# Patient Record
Sex: Female | Born: 1960
Health system: Southern US, Community
[De-identification: ages and names within clinical notes are randomized; demographics above are authoritative.]

## PROBLEM LIST (undated history)

## (undated) DIAGNOSIS — Z973 Presence of spectacles and contact lenses: Secondary | ICD-10-CM

## (undated) DIAGNOSIS — F419 Anxiety disorder, unspecified: Secondary | ICD-10-CM

## (undated) DIAGNOSIS — Z8719 Personal history of other diseases of the digestive system: Secondary | ICD-10-CM

## (undated) DIAGNOSIS — K219 Gastro-esophageal reflux disease without esophagitis: Secondary | ICD-10-CM

## (undated) DIAGNOSIS — N189 Chronic kidney disease, unspecified: Secondary | ICD-10-CM

## (undated) DIAGNOSIS — M1990 Unspecified osteoarthritis, unspecified site: Secondary | ICD-10-CM

## (undated) DIAGNOSIS — A6000 Herpesviral infection of urogenital system, unspecified: Secondary | ICD-10-CM

## (undated) DIAGNOSIS — Z01419 Encounter for gynecological examination (general) (routine) without abnormal findings: Secondary | ICD-10-CM

## (undated) DIAGNOSIS — F32A Depression, unspecified: Secondary | ICD-10-CM

## (undated) DIAGNOSIS — M12579 Traumatic arthropathy, unspecified ankle and foot: Secondary | ICD-10-CM

## (undated) DIAGNOSIS — M810 Age-related osteoporosis without current pathological fracture: Secondary | ICD-10-CM

## (undated) DIAGNOSIS — F329 Major depressive disorder, single episode, unspecified: Secondary | ICD-10-CM

## (undated) DIAGNOSIS — E669 Obesity, unspecified: Secondary | ICD-10-CM

## (undated) DIAGNOSIS — I10 Essential (primary) hypertension: Secondary | ICD-10-CM

## (undated) DIAGNOSIS — F17200 Nicotine dependence, unspecified, uncomplicated: Secondary | ICD-10-CM

## (undated) DIAGNOSIS — Z9289 Personal history of other medical treatment: Secondary | ICD-10-CM

## (undated) HISTORY — DX: Anxiety disorder, unspecified: F41.9

## (undated) HISTORY — DX: Encounter for gynecological examination (general) (routine) without abnormal findings: Z01.419

## (undated) HISTORY — PX: WISDOM TOOTH EXTRACTION: SHX21

## (undated) HISTORY — DX: Gilbert syndrome: E80.4

## (undated) HISTORY — DX: Nicotine dependence, unspecified, uncomplicated: F17.200

## (undated) HISTORY — DX: Herpesviral infection of urogenital system, unspecified: A60.00

## (undated) HISTORY — DX: Major depressive disorder, single episode, unspecified: F32.9

## (undated) HISTORY — DX: Essential (primary) hypertension: I10

## (undated) HISTORY — DX: Personal history of other medical treatment: Z92.89

## (undated) HISTORY — PX: CHALAZION EXCISION: SHX213

## (undated) HISTORY — DX: Unspecified osteoarthritis, unspecified site: M19.90

## (undated) HISTORY — DX: Personal history of other diseases of the digestive system: Z87.19

## (undated) HISTORY — DX: Presence of spectacles and contact lenses: Z97.3

## (undated) HISTORY — PX: HYSTEROSCOPY: SHX211

## (undated) HISTORY — DX: Gastro-esophageal reflux disease without esophagitis: K21.9

## (undated) HISTORY — DX: Depression, unspecified: F32.A

## (undated) HISTORY — DX: Chronic kidney disease, unspecified: N18.9

## (undated) HISTORY — DX: Age-related osteoporosis without current pathological fracture: M81.0

## (undated) HISTORY — DX: Traumatic arthropathy, unspecified ankle and foot: M12.579

## (undated) HISTORY — DX: Obesity, unspecified: E66.9

## (undated) HISTORY — PX: OTHER SURGICAL HISTORY: SHX169

---

## 1998-01-22 ENCOUNTER — Ambulatory Visit (HOSPITAL_COMMUNITY): Admission: RE | Admit: 1998-01-22 | Discharge: 1998-01-22 | Payer: Self-pay | Admitting: Family Medicine

## 1998-08-09 ENCOUNTER — Other Ambulatory Visit: Admission: RE | Admit: 1998-08-09 | Discharge: 1998-08-09 | Payer: Self-pay | Admitting: Obstetrics and Gynecology

## 1998-09-22 ENCOUNTER — Ambulatory Visit (HOSPITAL_COMMUNITY): Admission: RE | Admit: 1998-09-22 | Discharge: 1998-09-22 | Payer: Self-pay | Admitting: Obstetrics and Gynecology

## 1998-09-29 ENCOUNTER — Ambulatory Visit: Admission: RE | Admit: 1998-09-29 | Discharge: 1998-09-29 | Payer: Self-pay | Admitting: Family Medicine

## 1999-09-08 ENCOUNTER — Other Ambulatory Visit: Admission: RE | Admit: 1999-09-08 | Discharge: 1999-09-08 | Payer: Self-pay | Admitting: Obstetrics and Gynecology

## 2001-04-11 ENCOUNTER — Other Ambulatory Visit: Admission: RE | Admit: 2001-04-11 | Discharge: 2001-04-11 | Payer: Self-pay | Admitting: Obstetrics and Gynecology

## 2001-12-22 ENCOUNTER — Encounter: Payer: Self-pay | Admitting: Chiropractic Medicine

## 2001-12-22 ENCOUNTER — Ambulatory Visit (HOSPITAL_COMMUNITY): Admission: RE | Admit: 2001-12-22 | Discharge: 2001-12-22 | Payer: Self-pay | Admitting: Chiropractic Medicine

## 2002-05-22 ENCOUNTER — Other Ambulatory Visit: Admission: RE | Admit: 2002-05-22 | Discharge: 2002-05-22 | Payer: Self-pay | Admitting: Obstetrics and Gynecology

## 2003-06-22 ENCOUNTER — Ambulatory Visit (HOSPITAL_COMMUNITY): Admission: RE | Admit: 2003-06-22 | Discharge: 2003-06-22 | Payer: Self-pay | Admitting: Obstetrics and Gynecology

## 2003-10-23 ENCOUNTER — Other Ambulatory Visit: Admission: RE | Admit: 2003-10-23 | Discharge: 2003-10-23 | Payer: Self-pay | Admitting: Obstetrics and Gynecology

## 2004-06-23 ENCOUNTER — Ambulatory Visit (HOSPITAL_COMMUNITY): Admission: RE | Admit: 2004-06-23 | Discharge: 2004-06-23 | Payer: Self-pay | Admitting: Obstetrics and Gynecology

## 2005-01-26 ENCOUNTER — Other Ambulatory Visit: Admission: RE | Admit: 2005-01-26 | Discharge: 2005-01-26 | Payer: Self-pay | Admitting: Obstetrics and Gynecology

## 2005-07-03 ENCOUNTER — Ambulatory Visit (HOSPITAL_COMMUNITY): Admission: RE | Admit: 2005-07-03 | Discharge: 2005-07-03 | Payer: Self-pay | Admitting: Obstetrics and Gynecology

## 2006-06-01 ENCOUNTER — Ambulatory Visit: Payer: Self-pay | Admitting: Family Medicine

## 2006-06-12 ENCOUNTER — Ambulatory Visit: Payer: Self-pay | Admitting: Family Medicine

## 2006-06-14 ENCOUNTER — Encounter: Payer: Self-pay | Admitting: Cardiology

## 2006-06-14 ENCOUNTER — Ambulatory Visit: Payer: Self-pay

## 2006-06-14 DIAGNOSIS — Z9289 Personal history of other medical treatment: Secondary | ICD-10-CM

## 2006-06-14 HISTORY — DX: Personal history of other medical treatment: Z92.89

## 2006-07-12 ENCOUNTER — Ambulatory Visit (HOSPITAL_COMMUNITY): Admission: RE | Admit: 2006-07-12 | Discharge: 2006-07-12 | Payer: Self-pay | Admitting: Obstetrics and Gynecology

## 2007-07-15 ENCOUNTER — Ambulatory Visit (HOSPITAL_COMMUNITY): Admission: RE | Admit: 2007-07-15 | Discharge: 2007-07-15 | Payer: Self-pay | Admitting: Obstetrics and Gynecology

## 2007-07-24 ENCOUNTER — Ambulatory Visit: Payer: Self-pay | Admitting: Family Medicine

## 2008-07-17 ENCOUNTER — Ambulatory Visit (HOSPITAL_COMMUNITY): Admission: RE | Admit: 2008-07-17 | Discharge: 2008-07-17 | Payer: Self-pay | Admitting: Obstetrics and Gynecology

## 2008-07-29 ENCOUNTER — Inpatient Hospital Stay (HOSPITAL_COMMUNITY): Admission: EM | Admit: 2008-07-29 | Discharge: 2008-07-31 | Payer: Self-pay | Admitting: Emergency Medicine

## 2008-08-07 HISTORY — PX: ANKLE SURGERY: SHX546

## 2009-07-26 ENCOUNTER — Ambulatory Visit (HOSPITAL_COMMUNITY): Admission: RE | Admit: 2009-07-26 | Discharge: 2009-07-26 | Payer: Self-pay | Admitting: Obstetrics and Gynecology

## 2010-07-19 ENCOUNTER — Ambulatory Visit: Payer: Self-pay | Admitting: Family Medicine

## 2010-07-19 DIAGNOSIS — Z9289 Personal history of other medical treatment: Secondary | ICD-10-CM

## 2010-07-19 HISTORY — DX: Personal history of other medical treatment: Z92.89

## 2010-07-27 ENCOUNTER — Ambulatory Visit (HOSPITAL_COMMUNITY)
Admission: RE | Admit: 2010-07-27 | Discharge: 2010-07-27 | Payer: Self-pay | Source: Home / Self Care | Attending: Obstetrics and Gynecology | Admitting: Obstetrics and Gynecology

## 2010-08-16 ENCOUNTER — Ambulatory Visit
Admission: RE | Admit: 2010-08-16 | Discharge: 2010-08-16 | Payer: Self-pay | Source: Home / Self Care | Attending: Family Medicine | Admitting: Family Medicine

## 2010-10-14 ENCOUNTER — Ambulatory Visit (INDEPENDENT_AMBULATORY_CARE_PROVIDER_SITE_OTHER): Payer: 59 | Admitting: Family Medicine

## 2010-10-14 DIAGNOSIS — R079 Chest pain, unspecified: Secondary | ICD-10-CM

## 2010-10-14 DIAGNOSIS — I10 Essential (primary) hypertension: Secondary | ICD-10-CM

## 2010-10-14 DIAGNOSIS — Z7189 Other specified counseling: Secondary | ICD-10-CM

## 2010-12-20 NOTE — Op Note (Signed)
[name], [name] NO.:  [id number]   MEDICAL RECORD NO.:  [id number]          PATIENT TYPE:  INP   LOCATION:  1844                         FACILITY:  [facility]   PHYSICIAN:  Alvy Beal, MD    DATE OF BIRTH:  [birth year]   DATE OF PROCEDURE:  DATE OF DISCHARGE:                               OPERATIVE REPORT   PREOPERATIVE DIAGNOSIS:  Right trimalleolar ankle fracture subluxation.   POSTOPERATIVE DIAGNOSIS:  Right trimalleolar ankle fracture subluxation.   OPERATIVE PROCEDURE:  Open reduction internal fixation of the right  ankle.   FIRST ASSISTANT:  Crissie Reese, PA   INSTRUMENTATION USED:  DePuy small fragments with a six-hole locking  plate on the lateral side and 2 cannulated 4.5 screws on the medial  side.   COMPLICATIONS:  None.   CONDITION:  Stable.   TOURNIQUET TIME:  Approximately an hour and 40 minutes.   BLOOD LOSS:  No significant blood loss.   OPERATIVE HISTORY:  This is a very pleasant 50 year old woman who  presents after a fall on the ice today.  She is unable to ambulate and  was brought to the emergency room by her neighbor.  X-rays demonstrated  a trimalleolar ankle fracture.  After discussing risks, benefits, and  alternatives of surgery, the patient consented to an ORIF of the ankle.  All appropriate risks and benefits were discussed.   OPERATIVE NOTE:  The patient was brought to the operating room, placed  supine on the operating table.  After successful induction of general  anesthesia and endotracheal intubation, TED, SCDs, and Foley was  applied.  The tourniquet was placed on the right proximal thigh and the  right lower extremity was prepped and draped in standard fashion.  A  standard time out was done, confirmed, the right ankle was brought  inside.  The leg was exsanguinated and the tourniquet was inflated after  appropriate prepping and draping.   A lateral incision was made starting at the distal end of the  fibula and  proceeding approximately 10 cm.  Sharp dissection was carried out down  to the bone.  The bone was identified and the fracture site was  identified.  The enfolded periosteum and hematoma was removed and the  fracture was reduced and held in place with the clamp.  Fluoroscopy was  used to confirm that I had an anatomical correction.  I was also able to  directly visualize this and to also confirming anatomical reduction.  I  then contoured the plate and secured it to the fibula with 16-mm distal  unicortical cancellus screw followed by 14-mm unicortical cancellus  screw, both locking to the plate and then cortical 12, 14, and two 12s.  I was able to visualize the anterior surface of the fibula to confirm  that the screws were intraosseous and did not cut out.   At this point, x-rays demonstrated satisfactory reduction of the distal  fibula and the heart was in good position.   I then irrigated this wound copiously with normal saline, closed the  deep fascia with interrupted 2-0 Vicryl sutures and closed the  skin with  a running 2-0 PDS vertical mattress suture.  I then turned my attention  to the medial side.  A reverse hockey stick incision was made and I  dissected down to the medial malleolar fracture.  I identified the  fracture site, removed the hematoma and enfold the periosteum and  reduced it.  I was able to directly look at the axilla of the joint  space to confirm an anatomical reduction.  I then placed 2 guide pins  through the distal tip of the medial malleolus across the fracture site  and into the distal tibia.  This held the reduction in place.  I then  confirmed satisfactory position in the AP, lateral, and mortise views.  I confirmed that it was not intra-articular.  I then drilled the one  screw, placed a 44-mm screw with a washer on the most anterior and then  placed a 46-mm screw without a washer, the posterior one.  Reduction was  maintained both  radiographically and under direct visualization.  At  this point, I then irrigated the wound copiously with normal saline, and  then closed the deep fascia with 2-0 Vicryl sutures, and interrupted PDS  simple sutures for the skin.  A bulky dry dressing was applied as was  the posterior splint with side struts.  The patient was extubated and  transferred to PACU without incident.  At the end of the case, all  needle and sponge counts were correct.  The patient was hemodynamically  intact, moving all toes.      Alvy Beal, MD  Electronically Signed     DDB/MEDQ  D:  07/29/2008  T:  07/30/2008  Job:  [id number]

## 2010-12-20 NOTE — H&P (Signed)
NAME:  [name], STAPLES NO.:  [id number]   MEDICAL RECORD NO.:  [id number]          PATIENT TYPE:  EMS   LOCATION:  MAJO                         FACILITY:  [facility]   PHYSICIAN:  Alvy Beal, MD    DATE OF BIRTH:  February 21, 1961   DATE OF ADMISSION:  07/29/2008  DATE OF DISCHARGE:                              HISTORY & PHYSICAL   ADMITTING DIAGNOSIS:  Right ankle fracture.   HISTORY:  She is a very pleasant otherwise healthy 50 year old man who  was taking the garbage out earlier this morning and slipped and fell  while turning around on the ice.  She twisted the ankle and noted  immediate pain and inability to ambulate.  She was brought to the  emergency room by her neighbor and mother.  X-rays here in the emergency  room demonstrated a bimalleolar ankle fracture and so Orthopedic  consultation was requested.   PAST MEDICAL/SURGICAL/FAMILY/SOCIAL HISTORY:  She has had no significant  surgeries except for some foot surgeries by a podiatrist several years  ago.  She has mitral valve prolapse and no other significant medical  problems.   No known drug allergies.   She is just taking Cal-Citrate.  No other prescription medications.   On clinical exam, she is alert and oriented x3.  She was given some pain  medication, but she still coherent and can recall all of the events of  the injury.  She is afebrile.  Stable vital signs.  No shortness of  breath or chest pain.  The abdomen is soft and nontender.  She is moving  all her toes.  Capillary refill is less than 2 seconds.  The calf  compartments are soft and nontender.  She has discomfort and pain with  palpation of the ankle and obvious swelling.  No lacerations or  abrasions are noted.  No knee or hip pain on that extremity.   X-rays demonstrate a bimalleolar ankle fracture subluxation with distal  Weber B fibular fracture and an oblique medial malleolar fracture.   At this point in time, I have spoken with the  patient and her mother and  her husband by phone.  I have indicated that the treatment options  include cast immobilization and surgery.  I did think given the  bimalleolar fracture component, the best course of action is surgery.  We went over the risks, which include infection, bleeding, nerve damage,  death, stroke, paralysis, failure to heal, hardware failure, and need  for further surgery.  All of her questions were addressed and we will  plan on doing surgery later on this afternoon.  The patient last had  serial at 8:30 and so the safest time to do this will be at about 2:30.  We will admit her, keep her n.p.o., and plan on surgery this afternoon.      Alvy Beal, MD  Electronically Signed     DDB/MEDQ  D:  07/29/2008  T:  07/29/2008  Job:  [id number]

## 2011-01-18 ENCOUNTER — Telehealth: Payer: Self-pay | Admitting: *Deleted

## 2011-01-18 MED ORDER — LOSARTAN POTASSIUM-HCTZ 50-12.5 MG PO TABS: 1.0000 | ORAL_TABLET | Freq: Every day | ORAL | Status: DC

## 2011-01-18 NOTE — Telephone Encounter (Signed)
Patient switched to Hyzaar do to ACE cough.

## 2011-01-20 ENCOUNTER — Telehealth: Payer: Self-pay | Admitting: Family Medicine

## 2011-01-20 MED ORDER — AMLODIPINE BESYLATE 5 MG PO TABS: 5.0000 mg | ORAL_TABLET | Freq: Every day | ORAL | Status: DC

## 2011-01-20 NOTE — Telephone Encounter (Addendum)
Pt called stated new BP med not agreeing with her, light headed, dizzy, heart racing, hot flashes.  Doesn't feel good at all. Please call and advise her what to do? [facility]. Patient was called. I will switch her to Norvasc

## 2011-05-05 ENCOUNTER — Ambulatory Visit (INDEPENDENT_AMBULATORY_CARE_PROVIDER_SITE_OTHER): Payer: 59 | Admitting: Medical

## 2011-05-05 ENCOUNTER — Encounter: Payer: Self-pay | Admitting: Medical

## 2011-05-05 VITALS — BP 132/82 | HR 80 | Temp 98.1°F | Resp 18

## 2011-05-05 DIAGNOSIS — N390 Urinary tract infection, site not specified: Secondary | ICD-10-CM

## 2011-05-05 LAB — POCT URINALYSIS DIPSTICK
Bilirubin, UA: NEGATIVE
Glucose, UA: NEGATIVE
Ketones, UA: NEGATIVE
Leukocytes, UA: NEGATIVE
Nitrite, UA: POSITIVE
Protein, UA: NEGATIVE
Spec Grav, UA: 1.015
Urobilinogen, UA: NEGATIVE
pH, UA: 5

## 2011-05-05 MED ORDER — NITROFURANTOIN MONOHYD MACRO 100 MG PO CAPS: 100.0000 mg | ORAL_CAPSULE | Freq: Two times a day (BID) | ORAL | Status: AC

## 2011-05-05 MED ORDER — PHENAZOPYRIDINE HCL 100 MG PO TABS: 100.0000 mg | ORAL_TABLET | Freq: Three times a day (TID) | ORAL | Status: AC | PRN

## 2011-05-05 NOTE — Patient Instructions (Signed)
Urinary Tract Infection (UTI)   Infections of the urinary tract can start in several places. A bladder infection (cystitis), a kidney infection (pyelonephritis), and a prostate infection (prostatitis) are different types of urinary tract infections. They usually get better if treated with medicines (antibiotics) that kill germs. Take all the medicine until it is gone. You or your child may feel better in a few days, but TAKE ALL MEDICINE or the infection may not respond and may become more difficult to treat.   HOME CARE INSTRUCTIONS   Drink enough water and fluids to keep the urine clear or pale yellow. Cranberry juice is especially recommended, in addition to large amounts of water.   Avoid caffeine, tea, and carbonated beverages. They tend to irritate the bladder.   Alcohol may irritate the prostate.   Only take over-the-counter or prescription medicines for pain, discomfort, or fever as directed by your caregiver.   FINDING OUT THE RESULTS OF YOUR TEST   Not all test results are available during your visit. If your or your child's test results are not back during the visit, make an appointment with your caregiver to find out the results. Do not assume everything is normal if you have not heard from your caregiver or the medical facility. It is important for you to follow up on all test results.   TO PREVENT FURTHER INFECTIONS:   Empty the bladder often. Avoid holding urine for long periods of time.   After a bowel movement, women should cleanse from front to back. Use each tissue only once.   Empty the bladder before and after sexual intercourse.   SEEK MEDICAL CARE IF:   There is back pain.   You or your child has an oral temperature above 101.   Your baby is older than 3 months with a rectal temperature of 100.5º F (38.1° C) or higher for more than 1 day.   Your or your child's problems (symptoms) are no better in 3 days. Return sooner if you or your child is getting worse.   SEEK IMMEDIATE MEDICAL CARE IF:    There is severe back pain or lower abdominal pain.   You or your child develops chills.   You or your child has an oral temperature above 101, not controlled by medicine.   Your baby is older than 3 months with a rectal temperature of 102º F (38.9º C) or higher.   Your baby is 3 months old or younger with a rectal temperature of 100.4º F (38º C) or higher.   There is nausea or vomiting.   There is continued burning or discomfort with urination.   MAKE SURE YOU:   Understand these instructions.   Will watch this condition.   Will get help right away if you or your child is not doing well or gets worse.   Document Released: 05/03/2005 Document Re-Released: 10/18/2009   [employer]® Patient Information ©2011 [employer].

## 2011-05-05 NOTE — Progress Notes (Signed)
Subjective:    [name] is a 50 y.o. female who complains of burning with urination. She has had symptoms for 4 days. Patient also complains of urgency, frequency, odor, blood today visibly, lower abdominal pain, mild, and sympotms interfered with sleep last evening. Patient denies back pain, fever and vaginal discharge. Patient does not have a history of recurrent UTI. Patient does not have a history of pyelonephritis.  Last UTI years ago.  She does note last period in 09/2010.  She thinks she may be entering menopause.   The following portions of the patient's history were reviewed and updated as appropriate: allergies, current medications, past family history, past medical history, past social history, past surgical history and problem list.  Review of Systems  Constitutional: +chills; denies fever, sweats Cardiology: denies chest pain, palpitations Respiratory: denies cough, shortness of breath Gastroenterology: denies nausea, vomiting, diarrhea Urology: denies hematuria, incontinence    Objective:   Filed Vitals:   05/05/11 1131  BP: 132/82  Pulse: 80  Temp: 98.1 F (36.7 C)  Resp: 18    General appearance: alert, no distress, WD/WN,white female Oral cavity: MMM Heart: RRR, normal S1, S2, no murmurs Lungs: CTA bilaterally, no wheezes, rhonchi, or rales Abdomen: +bs, soft, mild lower abdominal tenderness, non distended, no masses, no hepatomegaly, no splenomegaly, no bruits Back: no CVA tenderness Pulses: 2+ symmetric   Assessment:   Encounter Diagnosis  Name Primary?  . UTI (urinary tract infection) Yes     Plan:    Medications: nitrofurantoin. Maintain adequate hydration. Follow up if symptoms not improving, and as needed.

## 2011-05-08 LAB — URINE CULTURE: Colony Count: >=100000

## 2011-05-12 LAB — CBC
HCT: 44.7 % (ref 36.0–46.0)
Hemoglobin: 15 g/dL (ref 12.0–15.0)
MCHC: 33.6 g/dL (ref 30.0–36.0)
MCV: 92.4 fL (ref 78.0–100.0)
Platelets: 233 10*3/uL (ref 150–400)
RBC: 4.84 MIL/uL (ref 3.87–5.11)
RDW: 12.6 % (ref 11.5–15.5)
WBC: 9.2 10*3/uL (ref 4.0–10.5)

## 2011-05-12 LAB — DIFFERENTIAL
Basophils Absolute: 0 10*3/uL (ref 0.0–0.1)
Basophils Relative: 0 % (ref 0–1)
Eosinophils Absolute: 0 10*3/uL (ref 0.0–0.7)
Eosinophils Relative: 0 % (ref 0–5)
Lymphocytes Relative: 11 % — ABNORMAL LOW (ref 12–46)
Lymphs Abs: 1 10*3/uL (ref 0.7–4.0)
Monocytes Absolute: 0.5 10*3/uL (ref 0.1–1.0)
Monocytes Relative: 5 % (ref 3–12)
Neutro Abs: 7.7 10*3/uL (ref 1.7–7.7)
Neutrophils Relative %: 84 % — ABNORMAL HIGH (ref 43–77)

## 2011-05-12 LAB — PROTIME-INR
INR: 1 (ref 0.00–1.49)
Prothrombin Time: 13.4 seconds (ref 11.6–15.2)

## 2011-05-12 LAB — POCT I-STAT, CHEM 8
BUN: 17 mg/dL (ref 6–23)
Calcium, Ion: 1.17 mmol/L (ref 1.12–1.32)
Chloride: 105 mEq/L (ref 96–112)
Creatinine, Ser: 0.8 mg/dL (ref 0.4–1.2)
Glucose, Bld: 159 mg/dL — ABNORMAL HIGH (ref 70–99)
HCT: 48 % — ABNORMAL HIGH (ref 36.0–46.0)
Hemoglobin: 16.3 g/dL — ABNORMAL HIGH (ref 12.0–15.0)
Potassium: 3.7 mEq/L (ref 3.5–5.1)
Sodium: 139 mEq/L (ref 135–145)
TCO2: 22 mmol/L (ref 0–100)

## 2011-05-12 LAB — APTT: aPTT: 22 seconds — ABNORMAL LOW (ref 24–37)

## 2011-05-29 ENCOUNTER — Encounter: Payer: Self-pay | Admitting: Family Medicine

## 2011-05-30 ENCOUNTER — Encounter: Payer: Self-pay | Admitting: Medical

## 2011-05-30 ENCOUNTER — Ambulatory Visit (INDEPENDENT_AMBULATORY_CARE_PROVIDER_SITE_OTHER): Payer: 59 | Admitting: Medical

## 2011-05-30 VITALS — BP 150/80 | HR 68 | Temp 97.7°F | Resp 16

## 2011-05-30 DIAGNOSIS — R300 Dysuria: Secondary | ICD-10-CM

## 2011-05-30 DIAGNOSIS — R319 Hematuria, unspecified: Secondary | ICD-10-CM

## 2011-05-30 DIAGNOSIS — I10 Essential (primary) hypertension: Secondary | ICD-10-CM | POA: Insufficient documentation

## 2011-05-30 LAB — POCT URINALYSIS DIPSTICK
Bilirubin, UA: NEGATIVE
Blood, UA: NEGATIVE
Glucose, UA: NEGATIVE
Ketones, UA: NEGATIVE
Leukocytes, UA: POSITIVE
Nitrite, UA: NEGATIVE
Protein, UA: NEGATIVE
Spec Grav, UA: 1.01
Urobilinogen, UA: NEGATIVE
pH, UA: 8

## 2011-05-30 MED ORDER — CIPROFLOXACIN HCL 500 MG PO TABS: 500.0000 mg | ORAL_TABLET | Freq: Two times a day (BID) | ORAL | Status: AC

## 2011-05-30 MED ORDER — AMLODIPINE BESYLATE 10 MG PO TABS: 10.0000 mg | ORAL_TABLET | Freq: Every day | ORAL | Status: DC

## 2011-05-30 NOTE — Progress Notes (Signed)
Subjective:   HPI  [name] is a 50 y.o. female who presents for 2 c/o.  She was seen by me about a month ago for UTI.  This cleared with antibiotic, but she thinks she has another UTI now.  She notes 2 days of mild urinary discomfort, urgency, but no blood, back or belly pain, no fever or chills.   LMP 2/12, no vaginal symptoms.    She is on blood pressure medication, but both here and other places such as the drug store, her BP has been elevated.  Thinks her BP medication isn't strong enough.  She says she can tell when her pressure is up based on how her body feels.   She is compliant with Amlodipine 5mg  daily.  She is exercising daily for at least 1 hour on the treadmill.  Uses no salt diet.  No other c/o.  The following portions of the patient's history were reviewed and updated as appropriate: allergies, current medications, past family history, past medical history, past social history, past surgical history and problem list.  Past Medical History  Diagnosis Date  . GERD (gastroesophageal reflux disease)   . Anxiety     GAD  . Hypertension   . MVP (mitral valve prolapse)     mild  . Migraine   . Endometriosis     Dr. Huntley Dec  . Traumatic arthritis of ankle     right  . Gilbert disease   . Obesity   . MVP (mitral valve prolapse)     MILD   History   Social History  . Marital Status: Married    Spouse Name: N/A    Number of Children: N/A  . Years of Education: N/A   Occupational History  . Not on file.   Social History Main Topics  . Smoking status: Former Smoker    Quit date: 05/04/2004  . Smokeless tobacco: Never Used  . Alcohol Use: 0.5 oz/week    1 drink(s) per week  . Drug Use: No  . Sexually Active: Not on file     exercising regularly   Other Topics Concern  . Not on file   Social History Narrative  . No narrative on file    Review of Systems Constitutional: -fever, -chills, +sweats, -unexpected -weight change,-fatigue ENT: -runny nose,  -ear pain, -sore throat Cardiology:  -chest pain, -palpitations, -edema Respiratory: -cough, -shortness of breath, -wheezing Gastroenterology: -abdominal pain, -nausea, -vomiting, -diarrhea, -constipation Hematology: -bleeding or bruising problems Musculoskeletal: -arthralgias, -myalgias, -joint swelling, -back pain Ophthalmology: -vision changes Urology: -dysuria, +difficulty urinating, +hematuria, -urinary frequency, -urgency Neurology: -headache, -weakness, -tingling, -numbness   Objective:   Physical Exam  Filed Vitals:   05/30/11 0839  BP: 150/80  Pulse: 68  Temp: 97.7 F (36.5 C)  Resp: 16    General appearance: alert, no distress, WD/WN, white female HEENT: normocephalic, sclerae anicteric, PERRLA, EOMi Neck: supple, no lymphadenopathy, no thyromegaly, no masses, no bruits Heart: RRR, normal S1, S2, no murmurs Lungs: CTA bilaterally, no wheezes, rhonchi, or rales Abdomen: +bs, soft, mild lower abdominal tenderness, non distended, no masses, no hepatomegaly, no splenomegaly Back: non tender Extremities: no edema, no cyanosis, no clubbing Pulses: 2+ symmetric, upper and lower extremities, normal cap refill Neurological: alert, oriented x 3, CN2-12 intact   Assessment and Plan :     Encounter Diagnoses  Name Primary?  . Essential hypertension, benign Yes  . Urine blood   . Dysuria     HTN - increase Amlodipine to 10mg   daily.  Recheck 62mo nurse visit BP check.  Return in January for annual physical/labs.  Dysuria - reviewed urine culture result from a month ago, +ecoli, sensitive to Macrobid which she used recently and sensitive to Cipro.  Will send script for Cipro.  Hydrate well, call/return if not improving.   Follow-up 62mo.

## 2011-06-23 ENCOUNTER — Other Ambulatory Visit (HOSPITAL_COMMUNITY): Payer: Self-pay | Admitting: Obstetrics and Gynecology

## 2011-06-23 DIAGNOSIS — Z1231 Encounter for screening mammogram for malignant neoplasm of breast: Secondary | ICD-10-CM

## 2011-06-26 ENCOUNTER — Other Ambulatory Visit: Payer: Self-pay | Admitting: Medical

## 2011-06-26 ENCOUNTER — Telehealth: Payer: Self-pay | Admitting: Medical

## 2011-06-26 MED ORDER — CIPROFLOXACIN HCL 500 MG PO TABS: 500.0000 mg | ORAL_TABLET | Freq: Two times a day (BID) | ORAL | Status: AC

## 2011-06-26 NOTE — Telephone Encounter (Signed)
rx sent for Cipro.  Lets have her recheck in 2wk OV to recheck and repeat urine and culture.

## 2011-07-14 ENCOUNTER — Ambulatory Visit (INDEPENDENT_AMBULATORY_CARE_PROVIDER_SITE_OTHER): Payer: 59 | Admitting: Medical

## 2011-07-14 ENCOUNTER — Encounter: Payer: Self-pay | Admitting: Medical

## 2011-07-14 DIAGNOSIS — R8290 Unspecified abnormal findings in urine: Secondary | ICD-10-CM

## 2011-07-14 DIAGNOSIS — R82998 Other abnormal findings in urine: Secondary | ICD-10-CM

## 2011-07-14 DIAGNOSIS — K5900 Constipation, unspecified: Secondary | ICD-10-CM | POA: Insufficient documentation

## 2011-07-14 DIAGNOSIS — N926 Irregular menstruation, unspecified: Secondary | ICD-10-CM

## 2011-07-14 DIAGNOSIS — I10 Essential (primary) hypertension: Secondary | ICD-10-CM

## 2011-07-14 LAB — POCT URINALYSIS DIPSTICK
Bilirubin, UA: NEGATIVE
Leukocytes, UA: NEGATIVE
Nitrite, UA: NEGATIVE
pH, UA: 7

## 2011-07-14 LAB — POCT URINE PREGNANCY: Preg Test, Ur: NEGATIVE

## 2011-07-14 MED ORDER — POLYETHYLENE GLYCOL 3350 17 G PO PACK: 17.0000 g | PACK | Freq: Every day | ORAL | Status: DC

## 2011-07-14 MED ORDER — POLYETHYLENE GLYCOL 3350 17 G PO PACK: 17.0000 g | PACK | Freq: Every day | ORAL | Status: AC

## 2011-07-14 NOTE — Progress Notes (Signed)
Subjective:   HPI  [name] is a 50 y.o. female who presents with recheck on urine.  I saw her about a month ago when she had 2 back to back UTIs.  This was a followup.  She has no current UTI symptoms.  She took Cipro last time and symptoms resolved.  She also wants a urine pregnancy test.  She recently divorced, has a new partner, is using condoms, but wants to make sure she isn't pregnant.  LMP around Halloween, but she is in pre menopause, last year only had 3 periods and they are slowing down.   She is also using pullout method and new partner has vasectomy planned in 2 weeks.  She has a gynecologist and sees them for pap/screening.  She also reports longstanding constipation since age 52yo.  Only has BMs about once weekly, gets a lot of gas.  No blood in stool.  No family hx/o colon cancer.  She is using laxatives occasionally.  She is also trying to change her diet to include more water, fiber, and fruits and potassium rich foods.  No other aggravating or relieving factors.    No other c/o.  The following portions of the patient's history were reviewed and updated as appropriate: allergies, current medications, past family history, past medical history, past social history, past surgical history and problem list.  Past Medical History  Diagnosis Date  . GERD (gastroesophageal reflux disease)   . Anxiety     GAD  . Hypertension   . MVP (mitral valve prolapse)     mild  . Migraine   . Endometriosis     Dr. Huntley Dec  . Traumatic arthritis of ankle     right  . Gilbert disease   . Obesity   . MVP (mitral valve prolapse)     MILD   Review of Systems Constitutional: -fever, -chills, -sweats, -unexpected -weight change,-fatigue ENT: -runny nose, -ear pain, -sore throat Cardiology:  -chest pain, -palpitations, -edema Respiratory: -cough, -shortness of breath, -wheezing Gastroenterology: -abdominal pain, -nausea, -vomiting, -diarrhea, -constipation Hematology: -bleeding or  bruising problems Musculoskeletal: -arthralgias, -myalgias, -joint swelling, -back pain Ophthalmology: -vision changes Urology: -dysuria, -difficulty urinating, -hematuria, -urinary frequency, -urgency Neurology: -headache, -weakness, -tingling, -numbness      Objective:   Physical Exam  Filed Vitals:   07/14/11 0811  BP: 150/80  Pulse: 68  Temp: 97.9 F (36.6 C)  Resp: 16    General appearance: alert, no distress, WD/WN, white female Oral cavity: MMM, no lesions Neck: supple, no lymphadenopathy, no thyromegaly, no masses Heart: RRR, normal S1, S2, no murmurs Lungs: CTA bilaterally, no wheezes, rhonchi, or rales Abdomen: +bs, soft, non tender, non distended, no masses, no hepatomegaly, no splenomegaly Pulses: 2+ symmetric, upper and lower extremities, normal cap refill   Assessment and Plan :    Encounter Diagnoses  Name Primary?  . Constipation Yes  . Essential hypertension, benign   . Abnormal urinalysis   . Missed period    Constipation - begin Miralax, discussed dietary changes, and call in 3-4 wk to make sure this is working  HTN - c/t same medication, check BP at home, and recheck 62mo.  Urinalysis today and urine pregnancy negative.    Follow-up with call in 62mo.

## 2011-08-03 ENCOUNTER — Ambulatory Visit (HOSPITAL_COMMUNITY): Payer: 59

## 2011-08-18 ENCOUNTER — Ambulatory Visit (HOSPITAL_COMMUNITY)
Admission: RE | Admit: 2011-08-18 | Discharge: 2011-08-18 | Disposition: A | Discharge status: Home / Self Care | Payer: 59 | Source: Ambulatory Visit | Attending: Obstetrics and Gynecology | Admitting: Obstetrics and Gynecology

## 2011-08-18 DIAGNOSIS — Z1231 Encounter for screening mammogram for malignant neoplasm of breast: Secondary | ICD-10-CM | POA: Insufficient documentation

## 2011-12-22 ENCOUNTER — Encounter: Payer: Self-pay | Admitting: Medical

## 2011-12-22 ENCOUNTER — Ambulatory Visit (INDEPENDENT_AMBULATORY_CARE_PROVIDER_SITE_OTHER): Payer: 59 | Admitting: Medical

## 2011-12-22 VITALS — BP 138/80 | HR 88 | Temp 98.3°F | Resp 16 | Wt 208.0 lb

## 2011-12-22 DIAGNOSIS — I10 Essential (primary) hypertension: Secondary | ICD-10-CM

## 2011-12-22 DIAGNOSIS — E663 Overweight: Secondary | ICD-10-CM

## 2011-12-22 LAB — CBC
Platelets: 273 10*3/uL (ref 150–400)
RBC: 5.02 MIL/uL (ref 3.87–5.11)
WBC: 6 10*3/uL (ref 4.0–10.5)

## 2011-12-22 LAB — LIPID PANEL
Total CHOL/HDL Ratio: 1.7 Ratio
VLDL: 8 mg/dL (ref 0–40)

## 2011-12-22 LAB — COMPREHENSIVE METABOLIC PANEL
ALT: 14 U/L (ref 0–35)
AST: 18 U/L (ref 0–37)
Chloride: 103 mEq/L (ref 96–112)
Creat: 0.8 mg/dL (ref 0.50–1.10)
Total Bilirubin: 1.1 mg/dL (ref 0.3–1.2)

## 2011-12-22 LAB — TSH: TSH: 1.5 u[IU]/mL (ref 0.350–4.500)

## 2011-12-22 MED ORDER — AMLODIPINE BESYLATE 10 MG PO TABS: 10.0000 mg | ORAL_TABLET | Freq: Every day | ORAL | Status: DC

## 2011-12-22 NOTE — Progress Notes (Signed)
  Subjective:   HPI  [name] is a 51 y.o. female who presents for routine check on HTN.  Compliant with medication, exercising 3 days per week with jogging, trying to eat healthy.  Not having any more constipation issues after going gluten free, and using Miralax some.  She learned that her mother gluten sensitivity.  She wants to begin prenatal vitamins OTC for hair health.   Last saw gynecology few months ago, sees Dr. Huntley Dec.  Up to date on mammogram and pap smear.   Never had colonoscopy.    No other c/o.  The following portions of the patient's history were reviewed and updated as appropriate: allergies, current medications, past family history, past medical history, past social history, past surgical history and problem list.  Past Medical History  Diagnosis Date  . GERD (gastroesophageal reflux disease)   . Anxiety     GAD  . Hypertension   . Migraine   . Endometriosis     Dr. Huntley Dec  . Traumatic arthritis of ankle     right  . Gilbert disease   . Obesity   . MVP (mitral valve prolapse)     MILD    Allergies  Allergen Reactions  . Ace Inhibitors Cough    Review of Systems ROS reviewed and was negative other than noted in HPI or above.    Objective:   Physical Exam  General appearance: alert, no distress, WD/WN Neck: supple, no lymphadenopathy, no thyromegaly, no masses Heart: RRR, normal S1, S2, no murmurs Lungs: CTA bilaterally, no wheezes, rhonchi, or rales Abdomen: +bs, soft, non tender, non distended, no masses, no hepatomegaly, no splenomegaly Pulses: 2+ symmetric Ext: no edema  Assessment and Plan :     Encounter Diagnoses  Name Primary?  . Essential hypertension, benign Yes  . Overweight    HTN - controlled on current medication, labs today  Overweight - discussed diet, exercise  Advised that she can use Prenatal vitamins OTC.    Of note, reviewed prior echocardiogram and based on heart exam and echo, I advised that she does not have  mitral valve prolapse.  Apparently somewhere along the way she was advised of this diagnosis, but there is not evidence of this currently.

## 2011-12-25 ENCOUNTER — Encounter: Payer: Self-pay | Admitting: Medical

## 2012-03-28 ENCOUNTER — Other Ambulatory Visit: Payer: Self-pay

## 2012-03-28 ENCOUNTER — Telehealth: Payer: Self-pay | Admitting: Family Medicine

## 2012-03-28 NOTE — Telephone Encounter (Signed)
Have her come by and drop off a urine specimen 

## 2012-03-28 NOTE — Telephone Encounter (Signed)
PT INFORMED TO DROP OF URINE SAMPLE

## 2012-03-29 ENCOUNTER — Other Ambulatory Visit (INDEPENDENT_AMBULATORY_CARE_PROVIDER_SITE_OTHER): Payer: 59

## 2012-03-29 ENCOUNTER — Other Ambulatory Visit: Payer: Self-pay | Admitting: Medical

## 2012-03-29 DIAGNOSIS — R300 Dysuria: Secondary | ICD-10-CM

## 2012-03-29 LAB — POCT URINALYSIS DIPSTICK
Bilirubin, UA: NEGATIVE
Ketones, UA: NEGATIVE
Leukocytes, UA: NEGATIVE
Nitrite, UA: NEGATIVE
Protein, UA: NEGATIVE
pH, UA: 8

## 2012-03-29 MED ORDER — NITROFURANTOIN MONOHYD MACRO 100 MG PO CAPS: 100.0000 mg | ORAL_CAPSULE | Freq: Two times a day (BID) | ORAL | Status: AC

## 2012-06-18 ENCOUNTER — Ambulatory Visit (INDEPENDENT_AMBULATORY_CARE_PROVIDER_SITE_OTHER): Payer: 59 | Admitting: Medical

## 2012-06-18 ENCOUNTER — Encounter: Payer: Self-pay | Admitting: Medical

## 2012-06-18 VITALS — BP 130/88 | HR 84 | Temp 98.1°F | Resp 16

## 2012-06-18 DIAGNOSIS — M76899 Other specified enthesopathies of unspecified lower limb, excluding foot: Secondary | ICD-10-CM

## 2012-06-18 DIAGNOSIS — M549 Dorsalgia, unspecified: Secondary | ICD-10-CM

## 2012-06-18 DIAGNOSIS — E663 Overweight: Secondary | ICD-10-CM

## 2012-06-18 DIAGNOSIS — M7062 Trochanteric bursitis, left hip: Secondary | ICD-10-CM

## 2012-06-18 DIAGNOSIS — I10 Essential (primary) hypertension: Secondary | ICD-10-CM

## 2012-06-18 MED ORDER — AMLODIPINE BESYLATE 10 MG PO TABS: 10.0000 mg | ORAL_TABLET | Freq: Every day | ORAL | Status: DC

## 2012-06-18 MED ORDER — IBUPROFEN 800 MG PO TABS: 800.0000 mg | ORAL_TABLET | Freq: Three times a day (TID) | ORAL | Status: DC | PRN

## 2012-06-18 NOTE — Progress Notes (Signed)
Subjective: Here for recheck on hypertension, med refill.  Doing well without c/o on medication Amlodipine.  Exercising some but not as much as prior. Eats healthy, does sometimes added salt.  Overall no regular/frequent headaches.  She does note some hot flashes.   LMP 03/2012, and periods are still mostly regular.  Her gynecologist told her she was perimenopausal.   She is going through a divorce, but is engaged to her current boyfriend.    She reports some left hip pain lately.  Flares up from time to time.  No injury or trauma.  She did lift something heavy few weeks ago and hurt her back.  Would like refill on Ibuprofen.   She has used this in the past for back pain.   Past Medical History  Diagnosis Date  . GERD (gastroesophageal reflux disease)   . Anxiety     GAD  . Hypertension   . Migraine   . Endometriosis     Dr. Huntley Dec  . Traumatic arthritis of ankle     right  . Gilbert disease   . Obesity    ROS as in HPI    Objective:   Physical Exam  Filed Vitals:   06/18/12 1421  BP: 130/88  Pulse: 84  Temp: 98.1 F (36.7 C)  Resp: 16    General appearance: alert, no distress, WD/WN Neck: supple, no lymphadenopathy, no thyromegaly, no masses Heart: RRR, normal S1, S2, no murmurs Lungs: CTA bilaterally, no wheezes, rhonchi, or rales Abdomen: +bs, soft, non tender, non distended, no masses, no hepatomegaly, no splenomegaly MSK: left leg tender over trochanteric bursa, otherwise hips and legs nontender, normal ROM, no swelling or deformity Back: mild lumbar midline tenderness, no pain with ROM, ROM relatively full Neuro: normal heel and toe walk, -SLR, normal UE and LE DTRs.  Pulses: 2+ symmetric, upper and lower extremities, normal cap refill   Assessment and Plan :    Encounter Diagnoses  Name Primary?  . Essential hypertension, benign Yes  . Overweight   . Trochanteric bursitis of left hip   . Back pain    HTN - controlled on current medication.  C/t Amlodipine  10mg  daily.  Exercise more, eat healthy low fat diet, no added salt.    Overweight - work on improving exercise,try and lose some weight.  Trochanteric bursitis of left hip.  Script for ibuprofen prn.  discussed diagnosis, treatment, prevention.  Back pain - ibuprofen prn but not daily.  Work on regular stretching, exercise.  Call/return if not improving or worse.   Follow-up 28mo.

## 2012-06-20 ENCOUNTER — Other Ambulatory Visit (INDEPENDENT_AMBULATORY_CARE_PROVIDER_SITE_OTHER): Payer: 59

## 2012-06-20 DIAGNOSIS — Z23 Encounter for immunization: Secondary | ICD-10-CM

## 2012-06-21 ENCOUNTER — Other Ambulatory Visit: Payer: 59

## 2012-09-21 ENCOUNTER — Other Ambulatory Visit: Payer: Self-pay

## 2012-10-14 ENCOUNTER — Other Ambulatory Visit (HOSPITAL_COMMUNITY): Payer: Self-pay | Admitting: Obstetrics and Gynecology

## 2012-10-14 DIAGNOSIS — Z1231 Encounter for screening mammogram for malignant neoplasm of breast: Secondary | ICD-10-CM

## 2012-10-25 ENCOUNTER — Ambulatory Visit (HOSPITAL_COMMUNITY)
Admission: RE | Admit: 2012-10-25 | Discharge: 2012-10-25 | Disposition: A | Discharge status: Home / Self Care | Payer: 59 | Source: Ambulatory Visit | Attending: Obstetrics and Gynecology | Admitting: Obstetrics and Gynecology

## 2012-10-25 DIAGNOSIS — Z1231 Encounter for screening mammogram for malignant neoplasm of breast: Secondary | ICD-10-CM

## 2012-12-11 ENCOUNTER — Ambulatory Visit (INDEPENDENT_AMBULATORY_CARE_PROVIDER_SITE_OTHER): Payer: 59 | Admitting: Medical

## 2012-12-11 ENCOUNTER — Encounter: Payer: Self-pay | Admitting: Medical

## 2012-12-11 VITALS — BP 132/80 | HR 88 | Temp 98.1°F | Resp 16 | Wt 217.0 lb

## 2012-12-11 DIAGNOSIS — M76899 Other specified enthesopathies of unspecified lower limb, excluding foot: Secondary | ICD-10-CM

## 2012-12-11 DIAGNOSIS — M549 Dorsalgia, unspecified: Secondary | ICD-10-CM

## 2012-12-11 DIAGNOSIS — M7062 Trochanteric bursitis, left hip: Secondary | ICD-10-CM

## 2012-12-11 DIAGNOSIS — M533 Sacrococcygeal disorders, not elsewhere classified: Secondary | ICD-10-CM

## 2012-12-11 DIAGNOSIS — I10 Essential (primary) hypertension: Secondary | ICD-10-CM

## 2012-12-11 DIAGNOSIS — E669 Obesity, unspecified: Secondary | ICD-10-CM

## 2012-12-11 MED ORDER — IBUPROFEN 800 MG PO TABS: 800.0000 mg | ORAL_TABLET | Freq: Three times a day (TID) | ORAL | Status: DC | PRN

## 2012-12-11 MED ORDER — AMLODIPINE BESYLATE 10 MG PO TABS: 10.0000 mg | ORAL_TABLET | Freq: Every day | ORAL | Status: DC

## 2012-12-11 NOTE — Progress Notes (Signed)
Subjective:  [name] is a 52 y.o. female who presents for interim f/u.  Needs refills on BP medication.  Not exercising as much as she should.   She notes diet is good though.  Sits at desk all day, has 1 hour commute each way daily.  Tries to get up somewhat often and walk at work.  Trying to exercise more in general.  She does get swelling with amlodipine but this is tolerable.   Takes Ibuprofen for low back pain and left hip prn, not very often.  Also been using Ibuprofen for teeth pain.  Having root canal next week.  Lying on back a certain way irritates the back.  Lifting things hurts.  If bending or squatting for long periods, this can aggravate.  Motion doesn't necessarily aggravate the back.   Using the stairs a lot aggravates the hip.  No other aggravating or relieving factors.    No other c/o.  The following portions of the patient's history were reviewed and updated as appropriate: allergies, current medications, past family history, past medical history, past social history, past surgical history and problem list.  ROS Otherwise as in subjective above  Past Medical History  Diagnosis Date  . GERD (gastroesophageal reflux disease)   . Anxiety     GAD  . Hypertension   . Migraine   . Endometriosis     Dr. Huntley Dec  . Traumatic arthritis of ankle     right  . Gilbert disease   . Obesity   . H/O echocardiogram 06/14/06    LV EF 55-60%, left atrium midly dilated, othewrise normal  . History of EKG 07/19/10    normal EKG  . Routine gynecological examination     sees gynecology yearly    Objective: Physical Exam  Vital signs reviewed  General appearance: alert, no distress, WD/WN  Neck: supple, no lymphadenopathy, no thyromegaly, no masses  Heart: RRR, normal S1, S2, no murmurs  Lungs: CTA bilaterally, no wheezes, rhonchi, or rales  Abdomen: +bs, soft, non tender, non distended, no masses, no hepatomegaly, no splenomegaly  MSK: left leg tender over trochanteric  bursa, otherwise hips and legs nontender, normal ROM, no swelling or deformity Back: mild lumbar midline tenderness, L>R, tender SI joints bilat, no pain with ROM, ROM relatively full  Neuro: normal heel and toe walk, -SLR, normal UE and LE DTRs.  Pulses: 2+ symmetric, upper and lower extremities, normal cap refill   Assessment: Encounter Diagnoses  Name Primary?  . Essential hypertension, benign Yes  . Obesity, unspecified   . Back pain   . Sacroiliac joint pain   . Trochanteric bursitis of left hip     Plan: HTN - controlled, c/t Amlodipine.  She doesn't want to switch due to edema since this has otherwise worked for her.   Reviewed last labs.  Obesity - advised weight loss, increased exercise, healthy diet.  Back pain, SI pain - hx/o disc herniation 2003 MRI, weight is a factor, large breasts could be a factor.  Advised increased exercise, but consider swimming, Yoga as well.   Ibuprofen prn, not daily.  If pain worsens, consider other therapies  Bursitis - declines steroidal injection.   Avoid activity that aggravates the pain.  Ibuprofen prn.   Follow up: 3mo on back, bursitis  Advised she f/u with gyn for pap soon, mammogram normal 10/2012, colonoscopy due age 9yo.  Congratulated her on her recent marriage.

## 2013-06-12 ENCOUNTER — Other Ambulatory Visit: Payer: Self-pay

## 2013-10-24 ENCOUNTER — Other Ambulatory Visit (HOSPITAL_COMMUNITY): Payer: Self-pay | Admitting: Obstetrics and Gynecology

## 2013-10-24 DIAGNOSIS — Z1231 Encounter for screening mammogram for malignant neoplasm of breast: Secondary | ICD-10-CM

## 2013-11-06 ENCOUNTER — Ambulatory Visit (HOSPITAL_COMMUNITY): Payer: 59

## 2013-11-18 ENCOUNTER — Ambulatory Visit (HOSPITAL_COMMUNITY)
Admission: RE | Admit: 2013-11-18 | Discharge: 2013-11-18 | Disposition: A | Discharge status: Home / Self Care | Payer: 59 | Source: Ambulatory Visit | Attending: Obstetrics and Gynecology | Admitting: Obstetrics and Gynecology

## 2013-11-18 DIAGNOSIS — Z1231 Encounter for screening mammogram for malignant neoplasm of breast: Secondary | ICD-10-CM | POA: Insufficient documentation

## 2013-12-09 ENCOUNTER — Other Ambulatory Visit: Payer: Self-pay | Admitting: Family Medicine

## 2013-12-09 ENCOUNTER — Encounter: Payer: Self-pay | Admitting: Medical

## 2013-12-09 ENCOUNTER — Ambulatory Visit (INDEPENDENT_AMBULATORY_CARE_PROVIDER_SITE_OTHER): Payer: 59 | Admitting: Medical

## 2013-12-09 VITALS — BP 120/80 | HR 82 | Temp 98.3°F | Resp 14 | Ht 67.25 in | Wt 222.0 lb

## 2013-12-09 DIAGNOSIS — E669 Obesity, unspecified: Secondary | ICD-10-CM

## 2013-12-09 DIAGNOSIS — I10 Essential (primary) hypertension: Secondary | ICD-10-CM

## 2013-12-09 DIAGNOSIS — N951 Menopausal and female climacteric states: Secondary | ICD-10-CM

## 2013-12-09 LAB — BASIC METABOLIC PANEL
BUN: 15 mg/dL (ref 6–23)
CO2: 26 mEq/L (ref 19–32)
CREATININE: 0.93 mg/dL (ref 0.50–1.10)
Calcium: 9.3 mg/dL (ref 8.4–10.5)
Chloride: 102 mEq/L (ref 96–112)
Glucose, Bld: 92 mg/dL (ref 70–99)
POTASSIUM: 3.9 meq/L (ref 3.5–5.3)
Sodium: 137 mEq/L (ref 135–145)

## 2013-12-09 MED ORDER — AMLODIPINE BESYLATE 10 MG PO TABS: 10.0000 mg | ORAL_TABLET | Freq: Every day | ORAL | Status: DC

## 2013-12-09 NOTE — Patient Instructions (Signed)
Thank you for giving me the opportunity to serve you today.   Your diagnosis today includes: Encounter Diagnoses  Name Primary?  . Essential hypertension, benign Yes  . Obesity, unspecified   . Menopausal state      Specific recommendations today include: Check insurance coverage for the following weight loss medications:  Qsymia  Phentermine  Belviq  Contrave  Consider OTC Estroven for menopausal symptoms  Consider Benadryl at bedtime for sleep  Consider Effexor prescription as alternate for menopausal symptoms.   Menopause Menopause is the normal time of life when menstrual periods stop completely. Menopause is complete when you have missed 12 consecutive menstrual periods. It usually occurs between the ages of 11 years and 91 years. Very rarely does a woman develop menopause before the age of 39 years. At menopause, your ovaries stop producing the female hormones estrogen and progesterone. This can cause undesirable symptoms and also affect your health. Sometimes the symptoms may occur 4 5 years before the menopause begins. There is no relationship between menopause and:  Oral contraceptives.  Number of children you had.  Race.  The age your menstrual periods started (menarche). Heavy smokers and very thin women may develop menopause earlier in life. CAUSES  The ovaries stop producing the female hormones estrogen and progesterone.  Other causes include:  Surgery to remove both ovaries.  The ovaries stop functioning for no known reason.  Tumors of the pituitary gland in the brain.  Medical disease that affects the ovaries and hormone production.  Radiation treatment to the abdomen or pelvis.  Chemotherapy that affects the ovaries. SYMPTOMS   Hot flashes.  Night sweats.  Decrease in sex drive.  Vaginal dryness and thinning of the vagina causing painful intercourse.  Dryness of the skin and developing  wrinkles.  Headaches.  Tiredness.  Irritability.  Memory problems.  Weight gain.  Bladder infections.  Hair growth of the face and chest.  Infertility. More serious symptoms include:  Loss of bone (osteoporosis) causing breaks (fractures).  Depression.  Hardening and narrowing of the arteries (atherosclerosis) causing heart attacks and strokes. DIAGNOSIS   When the menstrual periods have stopped for 12 straight months.  Physical exam.  Hormone studies of the blood. TREATMENT  There are many treatment choices and nearly as many questions about them. The decisions to treat or not to treat menopausal changes is an individual choice made with your health care provider. Your health care provider can discuss the treatments with you. Together, you can decide which treatment will work best for you. Your treatment choices may include:   Hormone therapy (estrogen and progesterone).  Non-hormonal medicines.  Treating the individual symptoms with medicine (for example antidepressants for depression).  Herbal medicines that may help specific symptoms.  Counseling by a psychiatrist or psychologist.  Group therapy.  Lifestyle changes including:  Eating healthy.  Regular exercise.  Limiting caffeine and alcohol.  Stress management and meditation.  No treatment. HOME CARE INSTRUCTIONS   Take the medicine your health care provider gives you as directed.  Get plenty of sleep and rest.  Exercise regularly.  Eat a diet that contains calcium (good for the bones) and soy products (acts like estrogen hormone).  Avoid alcoholic beverages.  Do not smoke.  If you have hot flashes, dress in layers.  Take supplements, calcium, and vitamin D to strengthen bones.  You can use over-the-counter lubricants or moisturizers for vaginal dryness.  Group therapy is sometimes very helpful.  Acupuncture may be helpful in some cases.  SEEK MEDICAL CARE IF:   You are not sure  you are in menopause.  You are having menopausal symptoms and need advice and treatment.  You are still having menstrual periods after age 20 years.  You have pain with intercourse.  Menopause is complete (no menstrual period for 12 months) and you develop vaginal bleeding.  You need a referral to a specialist (gynecologist, psychiatrist, or psychologist) for treatment. SEEK IMMEDIATE MEDICAL CARE IF:   You have severe depression.  You have excessive vaginal bleeding.  You fell and think you have a broken bone.  You have pain when you urinate.  You develop leg or chest pain.  You have a fast pounding heart beat (palpitations).  You have severe headaches.  You develop vision problems.  You feel a lump in your breast.  You have abdominal pain or severe indigestion. Document Released: 10/14/2003 Document Revised: 03/26/2013 Document Reviewed: 02/20/2013 [facility] Patient Information 2014 [place], [place].

## 2013-12-09 NOTE — Progress Notes (Signed)
,       Subjective:   [name] is a 53 y.o. female presenting on 12/09/2013 with medication check and would like options for hot flashes  Here for routine f/u on HTN.  Compliant with medication without c/o.   Exercises daily, doing 30 day body challenge where she uses different ex cerise style daily.  Working on eating a healthy diet.  Having hot flashes.  LMP 2 years ago  Sees gynecology, but insomnia and hot flashes didn't worsen til last few months.,  No prior medications for menopausal symptoms.  April 2014 got married.   No current vaginal bleeding, breast pain, mass or discharge.  Otherwise in normal state of health.  No other aggravating or relieving factors.  No other complaint.  Review of Systems ROS as in subjective      Objective:     Filed Vitals:   12/09/13 1434  BP: 120/80  Pulse: 82  Temp: 98.3 F (36.8 C)  Resp: 14    General appearance: alert, no distress, WD/WN Oral cavity: MMM, no lesions Neck: supple, no lymphadenopathy, no thyromegaly, no masses Heart: RRR, normal S1, S2, no murmurs Lungs: CTA bilaterally, no wheezes, rhonchi, or rales Abdomen: +bs, soft, non tender, non distended, no masses, no hepatomegaly, no splenomegaly Pulses: 2+ symmetric, upper and lower extremities, normal cap refill Ext: no edema     Assessment: Encounter Diagnoses  Name Primary?  . Essential hypertension, benign Yes  . Obesity, unspecified   . Menopausal state      Plan: HTN - compliant, c/t same medication, BMET lab today  Obesity - discussed possible medication to help assist her exercise and diet measures.  She will check insurance coverage regarding several medication options we discussed today  Menopause- discussed symptoms, possible  Medication treatments, possible non pharmaceutical measures.  Discussed effexor, HRT, OTC medications.  She will consider Estroven OTC.  Discuses rissk of this and HRT.  She will consider and let us know.   Keeana  was seen today for medication check and would like options for hot flashes.  Diagnoses and associated orders for this visit:  Essential hypertension, benign - Basic metabolic panel  Obesity, unspecified  Menopausal state     Return pending labs.

## 2013-12-11 ENCOUNTER — Telehealth: Payer: Self-pay

## 2013-12-11 NOTE — Telephone Encounter (Signed)
Lab results provided to patient.

## 2014-05-21 ENCOUNTER — Encounter: Payer: 59 | Admitting: Medical

## 2014-05-26 ENCOUNTER — Other Ambulatory Visit: Payer: Self-pay | Admitting: Family Medicine

## 2014-05-26 ENCOUNTER — Ambulatory Visit (INDEPENDENT_AMBULATORY_CARE_PROVIDER_SITE_OTHER): Payer: 59 | Admitting: Medical

## 2014-05-26 ENCOUNTER — Encounter: Payer: Self-pay | Admitting: Medical

## 2014-05-26 VITALS — BP 130/78 | HR 92 | Temp 98.1°F | Resp 16 | Wt 216.0 lb

## 2014-05-26 DIAGNOSIS — G4700 Insomnia, unspecified: Secondary | ICD-10-CM

## 2014-05-26 DIAGNOSIS — N951 Menopausal and female climacteric states: Secondary | ICD-10-CM

## 2014-05-26 DIAGNOSIS — Z23 Encounter for immunization: Secondary | ICD-10-CM

## 2014-05-26 DIAGNOSIS — E669 Obesity, unspecified: Secondary | ICD-10-CM

## 2014-05-26 DIAGNOSIS — I10 Essential (primary) hypertension: Secondary | ICD-10-CM

## 2014-05-26 MED ORDER — AMLODIPINE BESYLATE 10 MG PO TABS: 10.0000 mg | ORAL_TABLET | Freq: Every day | ORAL | Status: DC

## 2014-05-26 MED ORDER — CLONIDINE HCL 0.1 MG PO TABS: 0.1000 mg | ORAL_TABLET | Freq: Every day | ORAL | Status: DC

## 2014-05-26 NOTE — Progress Notes (Signed)
,    Subjective:   Kahlani Graber is a 53 y.o. female presenting on 05/26/2014 with medication check and Medication Refill  Here for routine f/u on HTN.  Compliant with medication without c/o.   Exercises daily, eating a healthy diet.  Just saw gynecology recently.   Still having menopausal symptoms, sleep is still terrible.  Hit or miss.  Some nights gets to sleep right away, some nights not getting to sleep.  Often wakes in middle of night with hot flashes.   Can't get back to sleep.   Review of Systems ROS as in subjective      Objective:   BP 130/78  Pulse 92  Temp(Src) 98.1 F (36.7 C) (Oral)  Resp 16  Wt 216 lb (97.977 kg)  LMP 06/07/2011  General appearance: alert, no distress, WD/WN Oral cavity: MMM, no lesions Neck: supple, no lymphadenopathy, no thyromegaly, no masses Heart: RRR, normal S1, S2, no murmurs Lungs: CTA bilaterally, no wheezes, rhonchi, or rales Abdomen: +bs, soft, non tender, non distended, no masses, no hepatomegaly, no splenomegaly Pulses: 2+ symmetric, upper and lower extremities, normal cap refill Ext: no edema     Assessment: Encounter Diagnoses  Name Primary?  . Essential hypertension Yes  . Obesity   . Insomnia   . Menopausal symptoms      Plan: HTN - compliant, c/t same medication, Norvasc 10mg  daily, return soon for fasting labs  Obesity - c/t efforts with diet and exercise. We will address insomnia.   Consider phentermine.  F/u pending labs  insomnia - dicussed concerns, sleep hygiene, begin trial of Clonidine QHS.  Discussed risks/benefits, not stopping the medication abruptly.  Menopausal symptoms - begin trial of Clonidine for sleep.     Maris was seen today for medication check and medication refill.  Diagnoses and associated orders for this visit:  Essential hypertension - CBC; Future - Lipid panel; Future - TSH; Future - Comprehensive metabolic panel; Future  Obesity - CBC; Future - Lipid panel;  Future - TSH; Future - Comprehensive metabolic panel; Future  Insomnia - CBC; Future - Lipid panel; Future - TSH; Future - Comprehensive metabolic panel; Future  Menopausal symptoms - CBC; Future - Lipid panel; Future - TSH; Future - Comprehensive metabolic panel; Future  Other Orders - amLODipine (NORVASC) 10 MG tablet; Take 1 tablet (10 mg total) by mouth daily. - cloNIDine (CATAPRES) 0.1 MG tablet; Take 1 tablet (0.1 mg total) by mouth at bedtime.     Return f/u for fasting labs.

## 2014-09-02 ENCOUNTER — Telehealth: Payer: Self-pay | Admitting: Family Medicine

## 2014-09-02 ENCOUNTER — Other Ambulatory Visit: Payer: Self-pay | Admitting: Medical

## 2014-09-02 ENCOUNTER — Other Ambulatory Visit: Payer: 59

## 2014-09-02 DIAGNOSIS — I10 Essential (primary) hypertension: Secondary | ICD-10-CM

## 2014-09-02 DIAGNOSIS — G4700 Insomnia, unspecified: Secondary | ICD-10-CM

## 2014-09-02 DIAGNOSIS — E669 Obesity, unspecified: Secondary | ICD-10-CM

## 2014-09-02 DIAGNOSIS — N951 Menopausal and female climacteric states: Secondary | ICD-10-CM

## 2014-09-02 LAB — LIPID PANEL
CHOL/HDL RATIO: 1.7 ratio
CHOLESTEROL: 148 mg/dL (ref 0–200)
HDL: 89 mg/dL (ref >39)
LDL Cholesterol: 49 mg/dL (ref 0–99)
Triglycerides: 52 mg/dL (ref <150)
VLDL: 10 mg/dL (ref 0–40)

## 2014-09-02 LAB — COMPREHENSIVE METABOLIC PANEL
ALT: 14 U/L (ref 0–35)
AST: 18 U/L (ref 0–37)
Albumin: 4 g/dL (ref 3.5–5.2)
Alkaline Phosphatase: 77 U/L (ref 39–117)
BUN: 13 mg/dL (ref 6–23)
CALCIUM: 9.3 mg/dL (ref 8.4–10.5)
CHLORIDE: 103 meq/L (ref 96–112)
CO2: 28 meq/L (ref 19–32)
Creat: 0.79 mg/dL (ref 0.50–1.10)
GLUCOSE: 86 mg/dL (ref 70–99)
Potassium: 4.8 mEq/L (ref 3.5–5.3)
Sodium: 139 mEq/L (ref 135–145)
TOTAL PROTEIN: 6.9 g/dL (ref 6.0–8.3)
Total Bilirubin: 1.2 mg/dL (ref 0.2–1.2)

## 2014-09-02 LAB — CBC
HCT: 43.5 % (ref 36.0–46.0)
Hemoglobin: 14.5 g/dL (ref 12.0–15.0)
MCH: 30.3 pg (ref 26.0–34.0)
MCHC: 33.3 g/dL (ref 30.0–36.0)
MCV: 90.8 fL (ref 78.0–100.0)
MPV: 8.9 fL (ref 8.6–12.4)
Platelets: 282 10*3/uL (ref 150–400)
RBC: 4.79 MIL/uL (ref 3.87–5.11)
RDW: 13.9 % (ref 11.5–15.5)
WBC: 6.5 10*3/uL (ref 4.0–10.5)

## 2014-09-02 LAB — TSH: TSH: 1.56 u[IU]/mL (ref 0.350–4.500)

## 2014-09-02 MED ORDER — AMLODIPINE BESYLATE 10 MG PO TABS: 10.0000 mg | ORAL_TABLET | Freq: Every day | ORAL | Status: DC

## 2014-09-02 NOTE — Telephone Encounter (Signed)
Pt came in for labs and wanted to remind you to refill her Amlodipine at [facility]

## 2014-10-13 ENCOUNTER — Ambulatory Visit (INDEPENDENT_AMBULATORY_CARE_PROVIDER_SITE_OTHER): Payer: 59 | Admitting: Psychology

## 2014-10-13 DIAGNOSIS — F4323 Adjustment disorder with mixed anxiety and depressed mood: Secondary | ICD-10-CM

## 2014-10-20 ENCOUNTER — Ambulatory Visit: Payer: Self-pay | Admitting: Psychology

## 2014-11-04 ENCOUNTER — Other Ambulatory Visit: Payer: Self-pay | Admitting: Family Medicine

## 2014-11-04 ENCOUNTER — Telehealth: Payer: Self-pay | Admitting: Family Medicine

## 2014-11-04 MED ORDER — AMLODIPINE BESYLATE 10 MG PO TABS: 10.0000 mg | ORAL_TABLET | Freq: Every day | ORAL | Status: DC

## 2014-11-04 NOTE — Telephone Encounter (Signed)
Did you read my message?  I said she likely would NOT need labs at the preventative care visit.  The only visit notes in the chart is for med checks.  Since I have not done a preventative care visit where we discuss vaccines, cancer screens, etc, that is the purpose of CPX visit.  This should be done yearly and would possibly take the place of some of her med check visits since she has been stable on her medications.   So plan a baseline CPX visit.

## 2014-11-04 NOTE — Telephone Encounter (Signed)
Pt is coming in 4/14 for cpe.  You just complete labs in January, will she need labs again and she wants to know if she actually needs CPE.  Pt ph [phone]

## 2014-11-04 NOTE — Telephone Encounter (Signed)
Patient states that she had lab work done in January why does she have to have to have additional labs done when she comes in on 11/19/14. Patient states that she see's GYN for her pap but she will and have them send over a copy of her last pap.  I sent in a 30 day supply of her BP medication.

## 2014-11-04 NOTE — Telephone Encounter (Signed)
I have only seen her for chronic medications refills, cholesterol, etc., but not technically a screening physical.  This was to establish preventative care visit and other preventative care recommendations and she likely will not need labs at that time if we do not have prior/recent Pap smear or gynecology information on file please get that as well. Suggest please keep the appointment but probably no labs at that visit

## 2014-11-10 ENCOUNTER — Encounter: Payer: Self-pay | Admitting: Medical

## 2014-11-19 ENCOUNTER — Encounter: Payer: Self-pay | Admitting: Medical

## 2014-11-19 ENCOUNTER — Ambulatory Visit (INDEPENDENT_AMBULATORY_CARE_PROVIDER_SITE_OTHER): Payer: 59 | Admitting: Medical

## 2014-11-19 ENCOUNTER — Telehealth: Payer: Self-pay | Admitting: Medical

## 2014-11-19 ENCOUNTER — Other Ambulatory Visit: Payer: Self-pay

## 2014-11-19 VITALS — BP 126/86 | HR 78 | Temp 98.0°F | Resp 15 | Ht 67.0 in | Wt 194.0 lb

## 2014-11-19 DIAGNOSIS — E669 Obesity, unspecified: Secondary | ICD-10-CM | POA: Diagnosis not present

## 2014-11-19 DIAGNOSIS — F411 Generalized anxiety disorder: Secondary | ICD-10-CM | POA: Insufficient documentation

## 2014-11-19 DIAGNOSIS — Z1211 Encounter for screening for malignant neoplasm of colon: Secondary | ICD-10-CM

## 2014-11-19 DIAGNOSIS — G4700 Insomnia, unspecified: Secondary | ICD-10-CM

## 2014-11-19 DIAGNOSIS — Z800 Family history of malignant neoplasm of digestive organs: Secondary | ICD-10-CM | POA: Diagnosis not present

## 2014-11-19 DIAGNOSIS — Z639 Problem related to primary support group, unspecified: Secondary | ICD-10-CM

## 2014-11-19 DIAGNOSIS — I10 Essential (primary) hypertension: Secondary | ICD-10-CM

## 2014-11-19 DIAGNOSIS — Z0000 Encounter for general adult medical examination without abnormal findings: Secondary | ICD-10-CM

## 2014-11-19 LAB — POCT URINALYSIS DIPSTICK
BILIRUBIN UA: NEGATIVE
GLUCOSE UA: NEGATIVE
Ketones, UA: NEGATIVE
LEUKOCYTES UA: NEGATIVE
NITRITE UA: NEGATIVE
RBC UA: NEGATIVE
Spec Grav, UA: 1.025
Urobilinogen, UA: NEGATIVE
pH, UA: 6

## 2014-11-19 LAB — HEMOGLOBIN A1C
HEMOGLOBIN A1C: 5.4 % (ref <5.7)
MEAN PLASMA GLUCOSE: 108 mg/dL (ref <117)

## 2014-11-19 MED ORDER — AMLODIPINE BESYLATE 10 MG PO TABS: 10.0000 mg | ORAL_TABLET | Freq: Every day | ORAL | Status: DC

## 2014-11-19 MED ORDER — RANITIDINE HCL 150 MG PO CAPS: 150.0000 mg | ORAL_CAPSULE | Freq: Two times a day (BID) | ORAL | Status: DC

## 2014-11-19 MED ORDER — ALPRAZOLAM 0.5 MG PO TABS: 0.5000 mg | ORAL_TABLET | Freq: Every evening | ORAL | Status: DC | PRN

## 2014-11-19 NOTE — Telephone Encounter (Signed)
Ordered through [facility]

## 2014-11-19 NOTE — Telephone Encounter (Signed)
Refer for first screening colonscopy

## 2014-11-19 NOTE — Progress Notes (Signed)
Subjective:   HPI  [name] is a 54 y.o. female who presents for a complete physical.  Medical care team/other doctors includes:  Dr Gaetano Net OBGYN 2016  Shawna Clamp therapist  Dr. Rolena Infante ortho  Eye doctor in [place]   Preventative care: Last physical or labs: 2014? Sees dentist yearly: Yes DR Nottage Last tetanus vaccine, TD or Tdap: within 5 years   Gynecology history: Pregnancy history gravida2 para1Contraception currently postmenopausal LMP 2-3 years ago Relationship status: separating Concern for STD No, last STD testing many years ago, declines screening today  Concerns: Sleep aid   Reviewed their medical, surgical, family, social, medication, and allergy history and updated chart as appropriate.  Past Medical History  Diagnosis Date  . GERD (gastroesophageal reflux disease)   . Anxiety     GAD  . Hypertension   . Migraine   . Endometriosis     Dr. Gertie Fey  . Traumatic arthritis of ankle     right  . Gilbert disease   . Obesity   . H/O echocardiogram 06/14/06    LV EF 55-60%, left atrium midly dilated, othewrise normal  . History of EKG 07/19/10    normal EKG  . Routine gynecological examination     sees gynecology yearly  . History of constipation     prior with bread/grains  . Depression     in late 90s  . Wears glasses   . Genital herpes   . Encounter for routine gynecological examination     Dr. Gaetano Net  . H/O echocardiogram 2007    normal    Past Surgical History  Procedure Laterality Date  . Hysteroscopy      endometriosis  . Cesarean section    . Ankle surgery  2010    right, ORIF, s/p trauma  . Colonoscopy      never as of 11/19/14    History   Social History  . Marital Status: Married    Spouse Name: N/A  . Number of Children: N/A  . Years of Education: N/A   Occupational History  . Not on file.   Social History Main Topics  . Smoking status: Former Smoker    Quit date: 05/04/2004  . Smokeless tobacco:  Never Used  . Alcohol Use: 7.8 oz/week    6 Glasses of wine, 6 Shots of liquor, 1 Standard drinks or equivalent per week  . Drug Use: No  . Sexual Activity: Not on file     Comment: exercising regularly   Other Topics Concern  . Not on file   Social History Narrative   Living with mother.  Recent separate 09/2014 from 2 year marriage.  Has 19 yo daughter.   Walks an hour daily for exercise.   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  As of 11/2013    Family History  Problem Relation Age of Onset  . Cancer Mother     skin  . Heart disease Mother   . Hypertension Mother   . Hypertension Father   . Cancer Father     mouth cancer  . Cancer Paternal Aunt   . Diabetes Paternal Grandmother   . Stroke Paternal Grandfather   . Diabetes Paternal Grandfather   . Cancer Brother 28    colon     Current outpatient prescriptions:  .  amLODipine (NORVASC) 10 MG tablet, Take 1 tablet (10 mg total) by mouth daily., Disp: 90 tablet, Rfl: 3 .  aspirin 81 MG tablet, Take 81  mg by mouth daily.  , Disp: , Rfl:  .  ibuprofen (ADVIL,MOTRIN) 800 MG tablet, Take 1 tablet (800 mg total) by mouth every 8 (eight) hours as needed for pain., Disp: 90 tablet, Rfl: 0 .  Prenatal Vit-Fe Fumarate-FA (PRENATAL MULTIVITAMIN) TABS, Take 1 tablet by mouth daily., Disp: , Rfl:  .  ranitidine (ZANTAC) 150 MG capsule, Take 1 capsule (150 mg total) by mouth 2 (two) times daily., Disp: 180 capsule, Rfl: 2 .  ALPRAZolam (XANAX) 0.5 MG tablet, Take 1 tablet (0.5 mg total) by mouth at bedtime as needed for anxiety., Disp: 30 tablet, Rfl: 0 .  Cholecalciferol (VITAMIN D-3 PO), Take by mouth., Disp: , Rfl:  .  cloNIDine (CATAPRES) 0.1 MG tablet, Take 1 tablet (0.1 mg total) by mouth at bedtime. (Patient not taking: Reported on 11/19/2014), Disp: 30 tablet, Rfl: 1 .  vitamin B-12 (CYANOCOBALAMIN) 100 MCG tablet, Take 100 mcg by mouth daily., Disp: , Rfl:   Allergies  Allergen Reactions  . Ace  Inhibitors Cough  . Other     Eye dilation dye    Review of Systems Constitutional: -fever, -chills, -sweats, -unexpected weight change, -decreased appetite, -fatigue Allergy: -sneezing, -itching, -congestion Dermatology: -changing moles, --rash, -lumps ENT: -runny nose, -ear pain, -sore throat, -hoarseness, -sinus pain, -teeth pain, - ringing in ears, -hearing loss, -nosebleeds Cardiology: -chest pain, -palpitations, -swelling, -difficulty breathing when lying flat, -waking up short of breath Respiratory: -cough, -shortness of breath, -difficulty breathing with exercise or exertion, -wheezing, -coughing up blood Gastroenterology: -abdominal pain, -nausea, -vomiting, -diarrhea, -constipation, -blood in stool, -changes in bowel movement, -difficulty swallowing or eating Hematology: -bleeding, -bruising  Musculoskeletal: -joint aches, -muscle aches, -joint swelling, -back pain, -neck pain, -cramping, -changes in gait Ophthalmology: denies vision changes, eye redness, itching, discharge Urology: -burning with urination, -difficulty urinating, -blood in urine, -urinary frequency, -urgency, -incontinence Neurology: -headache, -weakness, -tingling, -numbness, -memory loss, -falls, -dizziness Psychology: +depressed mood, -agitation, +sleep problems     Objective:   Physical Exam  BP 126/86 mmHg  Pulse 78  Temp(Src) 98 F (36.7 C) (Oral)  Resp 15  Ht 5\' 7"  (1.702 m)  Wt 194 lb (87.998 kg)  BMI 30.38 kg/m2  LMP 06/07/2011  General appearance: alert, no distress, WD/WN, obese white female Skin: scattered small cherry benign small 1-32mm diameter lesions throughout abdomen, few other scattered macules, no worrisome lesions HEENT: normocephalic, conjunctiva/corneas normal, sclerae anicteric, PERRLA, EOMi, nares patent, no discharge or erythema, pharynx normal Oral cavity: MMM, tongue normal, teeth in good repair Neck: supple, no lymphadenopathy, no thyromegaly, no masses, normal ROM, no  bruits Chest: non tender, normal shape and expansion Heart: RRR, normal S1, S2, no murmurs Lungs: CTA bilaterally, no wheezes, rhonchi, or rales Abdomen: +bs, soft, non tender, non distended, no masses, no hepatomegaly, no splenomegaly, no bruits Back: non tender, normal ROM, no scoliosis Musculoskeletal: left posterolateral heel with surgical scar, surgical scars bilat right foot posterolateral ankle and medial foot, otherwise upper extremities non tender, no obvious deformity, normal ROM throughout, lower extremities non tender, no obvious deformity, normal ROM throughout Extremities: no edema, no cyanosis, no clubbing Pulses: 2+ symmetric, upper and lower extremities, normal cap refill Neurological: alert, oriented x 3, CN2-12 intact, strength normal upper extremities and lower extremities, sensation normal throughout, DTRs 2+ throughout, no cerebellar signs, gait normal Psychiatric: normal affect, behavior normal, pleasant  Breast/gyn/rectal - deferred to gyn   Assessment and Plan :    Encounter Diagnoses  Name Primary?  . Encounter for health maintenance  examination in adult Yes  . Obesity   . Relationship problems   . Essential hypertension   . Insomnia   . Anxiety state   . Special screening for malignant neoplasms, colon   . Family history of colon cancer     Physical exam - discussed healthy lifestyle, diet, exercise, preventative care, vaccinations, and addressed their concerns.   See your dentist yearly for routine dental care including hygiene visits twice yearly. See your eye doctor yearly for routine vision care. See your gynecologist yearly for routine gynecological care.  Specific labs today.  Vaccinations: Advised yearly flu vaccine  Other concerns today: Obesity - work on weight loss, lifesytle changes Referral for first colonoscopy Relationship problems - c/t counseling, offered words of encouragement.  Advised she cut way back on alcohol since she is using  this for insomnia and to cope.  Begin xanax QHSprn for sleep  Follow up pending labs

## 2014-11-19 NOTE — Addendum Note (Signed)
Addended by: Louie Bun on: 11/19/2014 04:07 PM   Modules accepted: Orders

## 2014-11-20 LAB — MICROALBUMIN / CREATININE URINE RATIO
Creatinine, Urine: 238.2 mg/dL
MICROALB/CREAT RATIO: 7.1 mg/g (ref 0.0–30.0)
Microalb, Ur: 1.7 mg/dL (ref <2.0)

## 2014-12-28 ENCOUNTER — Telehealth: Payer: Self-pay | Admitting: Family Medicine

## 2014-12-28 ENCOUNTER — Other Ambulatory Visit: Payer: Self-pay | Admitting: Medical

## 2014-12-28 MED ORDER — SERTRALINE HCL 25 MG PO TABS: 25.0000 mg | ORAL_TABLET | Freq: Every day | ORAL | Status: DC

## 2014-12-28 NOTE — Telephone Encounter (Signed)
Pt has been taking Xanax as prescribed and she feels that she is getting more depressed. Zoloft has helped in the past. Can she try that?

## 2014-12-28 NOTE — Telephone Encounter (Signed)
Ok, begin Zoloft, and f/u in 3-4 wk

## 2014-12-29 NOTE — Telephone Encounter (Signed)
Patient is aware of Dorothea Ogle PA message and she understood

## 2015-01-28 ENCOUNTER — Encounter: Payer: Self-pay | Admitting: Internal Medicine

## 2015-03-24 ENCOUNTER — Encounter: Payer: Self-pay | Admitting: Family Medicine

## 2015-03-24 ENCOUNTER — Ambulatory Visit (INDEPENDENT_AMBULATORY_CARE_PROVIDER_SITE_OTHER): Payer: 59 | Admitting: Family Medicine

## 2015-03-24 VITALS — BP 140/84 | HR 64 | Temp 96.8°F | Wt 188.4 lb

## 2015-03-24 DIAGNOSIS — J351 Hypertrophy of tonsils: Secondary | ICD-10-CM | POA: Diagnosis not present

## 2015-03-24 DIAGNOSIS — B2790 Infectious mononucleosis, unspecified without complication: Secondary | ICD-10-CM

## 2015-03-24 DIAGNOSIS — J028 Acute pharyngitis due to other specified organisms: Secondary | ICD-10-CM | POA: Diagnosis not present

## 2015-03-24 LAB — POCT MONO (EPSTEIN BARR VIRUS): Mono, POC: POSITIVE — AB

## 2015-03-24 LAB — POCT RAPID STREP A (OFFICE): Rapid Strep A Screen: NEGATIVE

## 2015-03-24 NOTE — Patient Instructions (Signed)
  Your monospot test today was positive. Try to rest and stay hydrated. Recommend treating your symptoms. Antibiotics do not work for this virus. Let us know if your condition worsens.   Infectious Mononucleosis Infectious mononucleosis (mono) is a common germ (viral) infection in children, teenagers, and young adults.  CAUSES  Mono is an infection caused by the [name] virus. The virus is spread by close personal contact with someone who has the infection. It can be passed by contact with your saliva through things such as kissing or sharing drinking glasses. Sometimes, the infection can be spread from someone who does not appear sick but still spreads the virus (asymptomatic carrier state).  SYMPTOMS  The most common symptoms of Mono are:  Sore throat.  Headache.  Fatigue.  Muscle aches.  Swollen glands.  Fever.  Poor appetite.  Enlarged liver or spleen. The less common symptoms can include:  Rash.  Feeling sick to your stomach (nauseous).  Abdominal pain. DIAGNOSIS  Mono is diagnosed by a blood test.  TREATMENT  Treatment of mono is usually at home. There is no medicine that cures this virus. Sometimes hospital treatment is needed in severe cases. Steroid medicine sometimes is needed if the swelling in the throat causes breathing or swallowing problems.  HOME CARE INSTRUCTIONS   Drink enough fluids to keep your urine clear or pale yellow.  Eat soft foods. Cool foods like popsicles or ice cream can soothe a sore throat.  Only take over-the-counter or prescription medicines for pain, discomfort, or fever as directed by your caregiver. Children under 36 years of age should not take aspirin.  Gargle salt water. This may help relieve your sore throat. Put 1 teaspoon (tsp) of salt in 1 cup of warm water. Sucking on hard candy may also help.  Rest as needed.  Start regular activities gradually after the fever is gone. Be sure to rest when tired.  Avoid strenuous  exercise or contact sports until your caregiver says it is okay. The liver and spleen could be seriously injured.  Avoid sharing drinking glasses or kissing until your caregiver tells you that you are no longer contagious. SEEK MEDICAL CARE IF:   Your fever is not gone after 7 days.  Your activity level is not back to normal after 2 weeks.  You have yellow coloring to eyes and skin (jaundice). SEEK IMMEDIATE MEDICAL CARE IF:   You have severe pain in the abdomen or shoulder.  You have trouble swallowing or drooling.  You have trouble breathing.  You develop a stiff neck.  You develop a severe headache.  You cannot stop throwing up (vomiting).  You have convulsions.  You are confused.  You have trouble with balance.  You develop signs of body fluid loss (dehydration):  Weakness.  Sunken eyes.  Pale skin.  Dry mouth.  Rapid breathing or pulse. MAKE SURE YOU:   Understand these instructions.  Will watch your condition.  Will get help right away if you are not doing well or get worse. Document Released: 07/21/2000 Document Revised: 10/16/2011 Document Reviewed: 05/19/2008 [facility] Mc[place]re Information 2015 [place], [place]. This information is not intended to replace advice given to you by your health care provider. Make sure you discuss any questions you have with your health care provider.

## 2015-03-24 NOTE — Progress Notes (Signed)
   Subjective:    Patient ID: [name], female    DOB: [birth year], 54 y.o.   MRN: [id number]  HPI She is here for sore throat since yesterday and states she has enlarged tonsils with pus on them since yesterday also. She states she would like to have a strep test. She also complains associated symptoms of a mild headache and postnasal drainage since yesterday. Reports feeling tired this morning but yesterday had her usual energy level and exercised by running a mile and half. She denies fever, chills, ear pain, cough, or congestion. She denies sick contacts. She states she had strep a few years ago. No recent antibiotic use. She is a smoker. She is leaving for a cruise next Wednesday.  Reviewed allergies, past medical and social history.   Review of Systems Pertinent positives and negatives in history of present illness, systems otherwise negative.    Objective:   Physical Exam  Constitutional: She is oriented to person, place, and time. She appears well-developed and well-nourished. No distress.  HENT:  Head: Normocephalic and atraumatic.  Right Ear: External ear normal.  Left Ear: External ear normal.  Nose: Nose normal.  Mouth/Throat: Uvula is midline and mucous membranes are normal. No uvula swelling. Oropharyngeal exudate present.    Eyes: Conjunctivae and EOM are normal. Pupils are equal, round, and reactive to light. Right eye exhibits no discharge. Left eye exhibits no discharge.  Neck: Normal range of motion. Neck supple.  Pulmonary/Chest: Effort normal and breath sounds normal. No stridor.  Abdominal: Soft. Bowel sounds are normal. She exhibits no distension. There is no hepatosplenomegaly. There is no tenderness. There is no rebound and no guarding.  Lymphadenopathy:    She has no cervical adenopathy.  Neurological: She is alert and oriented to person, place, and time. She has normal strength.  Skin: Skin is warm, dry and intact. No rash noted. No cyanosis.   BP  140/84 mmHg  Pulse 64  Temp(Src) 96.8 F (36 C) (Tympanic)  Wt 188 lb 6.4 oz (85.458 kg)  LMP 06/07/2011  Rapid strep negative Monospot positive       Assessment & Plan:  Infectious mononucleosis - Plan: Mono (Epstein Barr Virus)  Acute pharyngitis due to other specified organisms - Plan: POCT rapid strep A  Enlarged tonsils  Since her tonsils are swollen, I educated her on when to seek medical care, such as difficulty swallowing or trouble breathing. She verbalized understanding that she could potentially need steroids or other interventions if they become larger and if she starts developing these symptoms. Provided education regarding infectious mononucleosis and encouraged her to rest, stay hydrated, and treat symptoms with acetaminophen or ibuprofen as needed. Also educated her on the fact that even though her spleen does not appear to be enlarged today that she should avoid activities that could cause injury to her abdomen and explained the significance of and the potential for an enlarged spleen and to prevent rupture. Advised her to let us know if her symptoms worsen.

## 2015-03-25 ENCOUNTER — Telehealth: Payer: Self-pay

## 2015-03-25 NOTE — Telephone Encounter (Signed)
Called pt at home to reschedule appt. Pt said she canceled due to recent diagnosis of mono. She said she will cb to resched appt when she is feeling better.

## 2015-04-09 ENCOUNTER — Encounter: Payer: Self-pay | Admitting: Internal Medicine

## 2015-04-13 ENCOUNTER — Encounter: Payer: Self-pay | Admitting: Internal Medicine

## 2015-06-04 ENCOUNTER — Ambulatory Visit (AMBULATORY_SURGERY_CENTER): Payer: Self-pay | Admitting: *Deleted

## 2015-06-04 VITALS — Ht 67.0 in | Wt 193.4 lb

## 2015-06-04 DIAGNOSIS — Z800 Family history of malignant neoplasm of digestive organs: Secondary | ICD-10-CM

## 2015-06-04 NOTE — Progress Notes (Signed)
Patient denies any allergies to egg or soy products. Patient denies complications with anesthesia/sedation.  Patient denies oxygen use at home and denies diet medications. Emmi instructions for colonoscopy explained but patient denied.     

## 2015-06-08 HISTORY — PX: COLONOSCOPY: SHX174

## 2015-06-15 ENCOUNTER — Encounter: Payer: Self-pay | Admitting: Internal Medicine

## 2015-06-18 ENCOUNTER — Ambulatory Visit (AMBULATORY_SURGERY_CENTER): Payer: 59 | Admitting: Internal Medicine

## 2015-06-18 ENCOUNTER — Encounter: Payer: Self-pay | Admitting: Internal Medicine

## 2015-06-18 VITALS — BP 117/77 | HR 79 | Temp 97.2°F | Resp 23 | Ht 67.0 in | Wt 193.0 lb

## 2015-06-18 DIAGNOSIS — Z800 Family history of malignant neoplasm of digestive organs: Secondary | ICD-10-CM | POA: Diagnosis present

## 2015-06-18 DIAGNOSIS — K635 Polyp of colon: Secondary | ICD-10-CM | POA: Diagnosis not present

## 2015-06-18 DIAGNOSIS — Z1211 Encounter for screening for malignant neoplasm of colon: Secondary | ICD-10-CM | POA: Diagnosis not present

## 2015-06-18 DIAGNOSIS — D125 Benign neoplasm of sigmoid colon: Secondary | ICD-10-CM | POA: Diagnosis not present

## 2015-06-18 DIAGNOSIS — D123 Benign neoplasm of transverse colon: Secondary | ICD-10-CM

## 2015-06-18 MED ORDER — SODIUM CHLORIDE 0.9 % IV SOLN: 500.0000 mL | INTRAVENOUS | Status: DC

## 2015-06-18 NOTE — Patient Instructions (Addendum)
I found and removed 2 tiny polyps - they look benign. You also have a condition called diverticulosis - common and not usually a problem. Please read the handout provided.  I will let you know pathology results and when to have another routine colonoscopy by mail.  I appreciate the opportunity to care for you. [name], MD, FACG  YOU HAD AN ENDOSCOPIC PROCEDURE TODAY AT THE [facility]:   Refer to the procedure report that was given to you for any specific questions about what was found during the examination.  If the procedure report does not answer your questions, please call your gastroenterologist to clarify.  If you requested that your care partner not be given the details of your procedure findings, then the procedure report has been included in a sealed envelope for you to review at your convenience later.  YOU SHOULD EXPECT: Some feelings of bloating in the abdomen. Passage of more gas than usual.  Walking can help get rid of the air that was put into your GI tract during the procedure and reduce the bloating. If you had a lower endoscopy (such as a colonoscopy or flexible sigmoidoscopy) you may notice spotting of blood in your stool or on the toilet paper. If you underwent a bowel prep for your procedure, you may not have a normal bowel movement for a few days.  Please Note:  You might notice some irritation and congestion in your nose or some drainage.  This is from the oxygen used during your procedure.  There is no need for concern and it should clear up in a day or so.  SYMPTOMS TO REPORT IMMEDIATELY:   Following lower endoscopy (colonoscopy or flexible sigmoidoscopy):  Excessive amounts of blood in the stool  Significant tenderness or worsening of abdominal pains  Swelling of the abdomen that is new, acute  Fever of 100F or higher   For urgent or emergent issues, a gastroenterologist can be reached at any hour by calling [phone].   DIET:  Your first meal following the procedure should be a small meal and then it is ok to progress to your normal diet. Heavy or fried foods are harder to digest and may make you feel nauseous or bloated.  Likewise, meals heavy in dairy and vegetables can increase bloating.  Drink plenty of fluids but you should avoid alcoholic beverages for 24 hours.  ACTIVITY:  You should plan to take it easy for the rest of today and you should NOT DRIVE or use heavy machinery until tomorrow (because of the sedation medicines used during the test).    FOLLOW UP: Our staff will call the number listed on your records the next business day following your procedure to check on you and address any questions or concerns that you may have regarding the information given to you following your procedure. If we do not reach you, we will leave a message.  However, if you are feeling well and you are not experiencing any problems, there is no need to return our call.  We will assume that you have returned to your regular daily activities without incident.  If any biopsies were taken you will be contacted by phone or by letter within the next 1-3 weeks.  Please call us at [phone] if you have not heard about the biopsies in 3 weeks.    SIGNATURES/CONFIDENTIALITY: You and/or your care partner have signed paperwork which will be entered into your electronic medical record.  These   These signatures attest to the fact that that the information above on your After Visit Summary has been reviewed and is understood.  Full responsibility of the confidentiality of this discharge information lies with you and/or your care-partner.

## 2015-06-18 NOTE — Progress Notes (Signed)
TO recovery, report to Tamala Julian, RN, VSS

## 2015-06-18 NOTE — Op Note (Signed)
[place]  [address] [place], [address]   COLONOSCOPY PROCEDURE REPORT  PATIENT: [name], [name]  MR#: [id number] BIRTHDATE: 02-Mar-1961 , 53  yrs. old GENDER: female ENDOSCOPIST: Gatha Mayer, MD, [facility] PROCEDURE DATE:  06/18/2015 PROCEDURE:   Colonoscopy, screening and Colonoscopy with snare polypectomy First Screening Colonoscopy - Avg.  risk and is 50 yrs.  old or older Yes.  Prior Negative Screening - Now for repeat screening. N/A  History of Adenoma - Now for follow-up colonoscopy & has been > or = to 3 yrs.  N/A  Polyps removed today? Yes ASA CLASS:   Class II INDICATIONS:Screening for colonic neoplasia and FH Colon Adenoma. MEDICATIONS: Propofol 320 mg IV and Monitored anesthesia care  DESCRIPTION OF PROCEDURE:   After the risks benefits and alternatives of the procedure were thoroughly explained, informed consent was obtained.  The digital rectal exam revealed no abnormalities of the rectum.   The LB [id number]-H190 [id number]  endoscope was introduced through the anus and advanced to the cecum, which was identified by both the appendix and ileocecal valve. No adverse events experienced.   The quality of the prep was excellent. (MiraLax was used)  The instrument was then slowly withdrawn as the colon was fully examined. Estimated blood loss is zero unless otherwise noted in this procedure report.   COLON FINDINGS: Two sessile polyps ranging from 2 to 60mm in size were found in the sigmoid colon and transverse colon. Polypectomies were performed with a cold snare.  The resection was complete, the polyp tissue was completely retrieved and sent to histology.   There was mild diverticulosis noted in the sigmoid colon.   The examination was otherwise normal.  Retroflexed views revealed no abnormalities. The time to cecum = 2.7 Withdrawal time = 11.6   The scope was withdrawn and the procedure completed. COMPLICATIONS: There were no immediate  complications.  ENDOSCOPIC IMPRESSION: 1.   Two sessile polyps ranging from 2 to 77mm in size were found in the sigmoid colon and transverse colon; polypectomies were performed with a cold snare 2.   Mild diverticulosis was noted in the sigmoid colon 3.   The examination was otherwise normal  RECOMMENDATIONS: Timing of repeat colonoscopy will be determined by pathology findings.  eSigned:  Gatha Mayer, MD, [facility] 06/18/2015 8:03 AM   cc: The Patient and Jill Alexanders

## 2015-06-18 NOTE — Progress Notes (Signed)
Called to room to assist during endoscopic procedure.  Patient ID and intended procedure confirmed with present staff. Received instructions for my participation in the procedure from the performing physician.  

## 2015-06-21 ENCOUNTER — Telehealth: Payer: Self-pay

## 2015-06-21 NOTE — Telephone Encounter (Signed)
Left message on answering machine. 

## 2015-06-23 ENCOUNTER — Encounter: Payer: Self-pay | Admitting: Internal Medicine

## 2015-06-23 NOTE — Progress Notes (Signed)
Quick Note:  Hyperplastic polyps - 10 yr 2026 recall ______

## 2015-10-06 ENCOUNTER — Other Ambulatory Visit: Payer: Self-pay | Admitting: Medical

## 2015-10-06 ENCOUNTER — Telehealth: Payer: Self-pay | Admitting: Family Medicine

## 2015-10-06 MED ORDER — IBUPROFEN 800 MG PO TABS: 800.0000 mg | ORAL_TABLET | Freq: Three times a day (TID) | ORAL | Status: DC | PRN

## 2015-10-06 NOTE — Telephone Encounter (Signed)
I sent the ibuprofen, also schedule a physical.  I think she is due in April

## 2015-10-06 NOTE — Telephone Encounter (Signed)
Called and informed pt, told her she needs a cpe said she would call back to schedule that,

## 2015-10-06 NOTE — Telephone Encounter (Signed)
Pt called for refill of Ibuprofen 800mg  refill to [facility].  Pt ph [phone]

## 2015-11-23 ENCOUNTER — Ambulatory Visit (INDEPENDENT_AMBULATORY_CARE_PROVIDER_SITE_OTHER): Payer: 59 | Admitting: Medical

## 2015-11-23 ENCOUNTER — Encounter: Payer: Self-pay | Admitting: Medical

## 2015-11-23 VITALS — BP 132/90 | HR 89 | Ht 66.75 in | Wt 192.0 lb

## 2015-11-23 DIAGNOSIS — Z720 Tobacco use: Secondary | ICD-10-CM

## 2015-11-23 DIAGNOSIS — I10 Essential (primary) hypertension: Secondary | ICD-10-CM | POA: Diagnosis not present

## 2015-11-23 DIAGNOSIS — E669 Obesity, unspecified: Secondary | ICD-10-CM

## 2015-11-23 DIAGNOSIS — Z800 Family history of malignant neoplasm of digestive organs: Secondary | ICD-10-CM

## 2015-11-23 DIAGNOSIS — Z0000 Encounter for general adult medical examination without abnormal findings: Secondary | ICD-10-CM | POA: Diagnosis not present

## 2015-11-23 DIAGNOSIS — F411 Generalized anxiety disorder: Secondary | ICD-10-CM

## 2015-11-23 DIAGNOSIS — Z23 Encounter for immunization: Secondary | ICD-10-CM | POA: Diagnosis not present

## 2015-11-23 DIAGNOSIS — T148XXA Other injury of unspecified body region, initial encounter: Secondary | ICD-10-CM

## 2015-11-23 DIAGNOSIS — G4700 Insomnia, unspecified: Secondary | ICD-10-CM

## 2015-11-23 DIAGNOSIS — F17200 Nicotine dependence, unspecified, uncomplicated: Secondary | ICD-10-CM

## 2015-11-23 DIAGNOSIS — T148 Other injury of unspecified body region: Secondary | ICD-10-CM | POA: Diagnosis not present

## 2015-11-23 LAB — CBC
HCT: 43.9 % (ref 35.0–45.0)
Hemoglobin: 14.7 g/dL (ref 11.7–15.5)
MCH: 31.5 pg (ref 27.0–33.0)
MCHC: 33.5 g/dL (ref 32.0–36.0)
MCV: 94 fL (ref 80.0–100.0)
MPV: 9.3 fL (ref 7.5–12.5)
PLATELETS: 259 10*3/uL (ref 140–400)
RBC: 4.67 MIL/uL (ref 3.80–5.10)
RDW: 13.8 % (ref 11.0–15.0)
WBC: 7.2 10*3/uL (ref 4.0–10.5)

## 2015-11-23 LAB — COMPREHENSIVE METABOLIC PANEL
ALBUMIN: 4 g/dL (ref 3.6–5.1)
ALK PHOS: 64 U/L (ref 33–130)
ALT: 9 U/L (ref 6–29)
AST: 16 U/L (ref 10–35)
BILIRUBIN TOTAL: 1 mg/dL (ref 0.2–1.2)
BUN: 12 mg/dL (ref 7–25)
CO2: 26 mmol/L (ref 20–31)
CREATININE: 0.83 mg/dL (ref 0.50–1.05)
Calcium: 9.3 mg/dL (ref 8.6–10.4)
Chloride: 102 mmol/L (ref 98–110)
GLUCOSE: 93 mg/dL (ref 65–99)
Potassium: 4.5 mmol/L (ref 3.5–5.3)
SODIUM: 139 mmol/L (ref 135–146)
Total Protein: 6.9 g/dL (ref 6.1–8.1)

## 2015-11-23 LAB — LIPID PANEL
Cholesterol: 170 mg/dL (ref 125–200)
HDL: 99 mg/dL (ref >=46)
LDL CALC: 59 mg/dL (ref <130)
Total CHOL/HDL Ratio: 1.7 Ratio (ref <=5.0)
Triglycerides: 61 mg/dL (ref <150)
VLDL: 12 mg/dL (ref <30)

## 2015-11-23 LAB — POCT URINALYSIS DIPSTICK
BILIRUBIN UA: NEGATIVE
GLUCOSE UA: NEGATIVE
Ketones, UA: NEGATIVE
Leukocytes, UA: NEGATIVE
Nitrite, UA: NEGATIVE
Protein, UA: NEGATIVE
RBC UA: NEGATIVE
Spec Grav, UA: 1.015
UROBILINOGEN UA: NEGATIVE
pH, UA: 7

## 2015-11-23 LAB — HEMOGLOBIN A1C
HEMOGLOBIN A1C: 5.1 % (ref <5.7)
Mean Plasma Glucose: 100 mg/dL

## 2015-11-23 MED ORDER — AMLODIPINE BESYLATE 10 MG PO TABS: 10.0000 mg | ORAL_TABLET | Freq: Every day | ORAL | Status: DC

## 2015-11-23 MED ORDER — ASPIRIN 81 MG PO TABS: 81.0000 mg | ORAL_TABLET | Freq: Every day | ORAL | Status: DC

## 2015-11-23 MED ORDER — ATENOLOL 25 MG PO TABS: 25.0000 mg | ORAL_TABLET | Freq: Every day | ORAL | Status: DC

## 2015-11-23 MED ORDER — ALPRAZOLAM 0.5 MG PO TABS: 0.5000 mg | ORAL_TABLET | Freq: Every evening | ORAL | Status: DC | PRN

## 2015-11-23 MED ORDER — RANITIDINE HCL 150 MG PO CAPS: 150.0000 mg | ORAL_CAPSULE | Freq: Two times a day (BID) | ORAL | Status: DC

## 2015-11-23 NOTE — Progress Notes (Signed)
Subjective:   HPI  [name] is a 55 y.o. female who presents for a complete physical. Chief Complaint  Patient presents with  . Annual Exam    fasting. GYN, [place]. sees eye doctor, on [place]. sees dentist, [place]. fell in the shower 6 weeks ago and still has knot on her shin    Medical care team/other doctors includes:  Dr Gaetano Net, gyn  Shawna Clamp therapist in the past  Dr. Rolena Infante ortho  Eye doctor  Dr. Raelyn Ensign dentist   Dermatology Glade Lloyd, Corrinna Karapetyan [facility], PA-C here for primary care  Concerns: [place] in shower few weeks ago, has knot on right front of lower leg that hasn't resolved.  Slipped on a new shower mat.  Has panic attacks every now and then, would like refill on xanax.   Living with mom, has stress, working 4 jobs, 2 side jobs, her primary job, and Estate agent for another company.  Is still finishing her debts from divorce last year, and now trying to position herself to buy a house and come up with a down payment.   Reviewed their medical, surgical, family, social, medication, and allergy history and updated chart as appropriate.  Past Medical History  Diagnosis Date  . GERD (gastroesophageal reflux disease)   . Anxiety     GAD  . Hypertension   . Migraine   . Endometriosis     Dr. Gertie Fey  . Traumatic arthritis of ankle     right -  . Rosanna Randy disease     patient denies   . Obesity   . H/O echocardiogram 06/14/06    LV EF 55-60%, left atrium midly dilated, othewrise normal  . History of EKG 07/19/10    normal EKG  . Routine gynecological examination     sees gynecology yearly  . History of constipation     prior with bread/grains, uses miralax prn with good results  . Depression     in late 90s  . Wears glasses   . Genital herpes     no recent outbreaks, no med  . Encounter for routine gynecological examination     Dr. Gaetano Net  . Smoker     Past Surgical History  Procedure Laterality Date  . Hysteroscopy      endometriosis  .  Cesarean section      x 1  . Ankle surgery  2010    right, ORIF, s/p trauma  . Wisdom tooth extraction    . Left breast surgery      removed benign cyst  . Chalazion excision    . Colonoscopy  11/16    2 polyps, mild diverticulosis, repeat in 10 years; Dr. Silvano Rusk    Social History   Social History  . Marital Status: Married    Spouse Name: N/A  . Number of Children: N/A  . Years of Education: N/A   Occupational History  . Not on file.   Social History Main Topics  . Smoking status: Current Every Day Smoker -- 0.75 packs/day    Types: Cigarettes  . Smokeless tobacco: Never Used  . Alcohol Use: 12.6 oz/week    0 Glasses of wine, 21 Shots of liquor, 0 Standard drinks or equivalent per week  . Drug Use: No  . Sexual Activity: Yes    Birth Control/ Protection: Post-menopausal   Other Topics Concern  . Not on file   Social History Narrative   Living with mother.  Recent separate 09/2014 from 2 year marriage.  Has 23  yo daughter.   Walks an hour daily for exercise.   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  As of 11/2015    Family History  Problem Relation Age of Onset  . Cancer Mother     skin  . Heart disease Mother   . Hypertension Mother   . Hypertension Father   . Cancer Father     mouth cancer  . Cancer Paternal Aunt   . Diabetes Paternal Grandmother   . Stroke Paternal Grandfather   . Diabetes Paternal Grandfather   . Cancer Brother 75    colon  . Colon cancer Brother 77  . Colon polyps Neg Hx   . Esophageal cancer Neg Hx   . Rectal cancer Neg Hx   . Stomach cancer Neg Hx      Current outpatient prescriptions:  .  amLODipine (NORVASC) 10 MG tablet, Take 1 tablet (10 mg total) by mouth daily., Disp: 90 tablet, Rfl: 3 .  aspirin 81 MG tablet, Take 81 mg by mouth daily.  , Disp: , Rfl:  .  ranitidine (ZANTAC) 150 MG capsule, Take 1 capsule (150 mg total) by mouth 2 (two) times daily., Disp: 180 capsule, Rfl: 2 .  ALPRAZolam  (XANAX) 0.5 MG tablet, Take 1 tablet (0.5 mg total) by mouth at bedtime as needed for anxiety. (Patient not taking: Reported on 06/04/2015), Disp: 30 tablet, Rfl: 0 .  ibuprofen (ADVIL,MOTRIN) 800 MG tablet, Take 1 tablet (800 mg total) by mouth every 8 (eight) hours as needed. (Patient not taking: Reported on 11/23/2015), Disp: 30 tablet, Rfl: 0  Allergies  Allergen Reactions  . Ace Inhibitors Cough  . Other     Eye dilation dye    Review of Systems Constitutional: -fever, -chills, -sweats, -unexpected weight change, -decreased appetite, -fatigue Allergy: -sneezing, -itching, -congestion Dermatology: -changing moles, --rash, -lumps ENT: -runny nose, -ear pain, -sore throat, -hoarseness, -sinus pain, -teeth pain, - ringing in ears, -hearing loss, -nosebleeds Cardiology: -chest pain, -palpitations, -swelling, -difficulty breathing when lying flat, -waking up short of breath Respiratory: -cough, -shortness of breath, -difficulty breathing with exercise or exertion, -wheezing, -coughing up blood Gastroenterology: -abdominal pain, -nausea, -vomiting, -diarrhea, -constipation, -blood in stool, -changes in bowel movement, -difficulty swallowing or eating Hematology: -bleeding, -bruising  Musculoskeletal: -joint aches, -muscle aches, -joint swelling, -back pain, -neck pain, -cramping, -changes in gait Ophthalmology: denies vision changes, eye redness, itching, discharge Urology: -burning with urination, -difficulty urinating, -blood in urine, -urinary frequency, -urgency, -incontinence Neurology: -headache, -weakness, -tingling, -numbness, -memory loss, -falls, -dizziness Psychology: +depressed mood, -agitation, +sleep problems     Objective:   Physical Exam  BP 132/90 mmHg  Pulse 89  Ht 5' 6.75" (1.695 m)  Wt 192 lb (87.091 kg)  BMI 30.31 kg/m2  LMP 06/07/2011  General appearance: alert, no distress, WD/WN, obese white female Skin: scattered small cherry benign small 1-22mm diameter  lesions throughout abdomen, few other scattered macules, no worrisome lesions HEENT: normocephalic, conjunctiva/corneas normal, sclerae anicteric, PERRLA, EOMi, nares patent, no discharge or erythema, pharynx normal Oral cavity: MMM, tongue normal, teeth in good repair Neck: supple, no lymphadenopathy, no thyromegaly, no masses, normal ROM, no bruits Chest: non tender, normal shape and expansion Heart: RRR, normal S1, S2, no murmurs Lungs: CTA bilaterally, no wheezes, rhonchi, or rales Abdomen: +bs, soft, non tender, non distended, no masses, no hepatomegaly, no splenomegaly, no bruits Back: non tender, normal ROM, no scoliosis Musculoskeletal: surgical scars bilat right foot posterolateral ankle and medial foot, otherwise upper extremities  non tender, no obvious deformity, normal ROM throughout, lower extremities non tender, no obvious deformity, normal ROM throughout Extremities: no edema, no cyanosis, no clubbing Pulses: 2+ symmetric, upper and lower extremities, normal cap refill Neurological: alert, oriented x 3, CN2-12 intact, strength normal upper extremities and lower extremities, sensation normal throughout, DTRs 2+ throughout, no cerebellar signs, gait normal Psychiatric: normal affect, behavior normal, pleasant  Breast/gyn/rectal - deferred to gyn   Adult ECG Report  Indication: hypertension  Rate: 65 bpm  Rhythm: normal sinus rhythm  QRS Axis: 31 degrees  PR Interval: 175ms  QRS Duration: 56ms  QTc: 451ms  Conduction Disturbances: none  Other Abnormalities: Q in III  Patient's cardiac risk factors are: hypertension and obesity (BMI >= 30 kg/m2).  EKG comparison: 2011  Narrative Interpretation: Q in III compared to 2011 EKG    Assessment and Plan :    Encounter Diagnoses  Name Primary?  . Routine general medical examination at a health care facility Yes  . Essential hypertension   . Insomnia   . Anxiety state   . Family history colon cancer - brother at 47 dx   .  Obesity   . Smoker   . Need for prophylactic vaccination against Streptococcus pneumoniae (pneumococcus)   . Hematoma     Physical exam - discussed healthy lifestyle, diet, exercise, preventative care, vaccinations, and addressed their concerns.   See your dentist yearly for routine dental care including hygiene visits twice yearly. See your eye doctor yearly for routine vision care. See your gynecologist yearly for routine gynecological care. See dermatology periodically  Vaccinations: Advised yearly flu vaccine Counseled on the pneumococcal vaccine.  Vaccine information sheet given.  Pneumococcal vaccine PPSV23 given after consent obtained.  Other concerns today: Obesity - work on weight loss, lifestyle changes advised she cut back on alcohol Hypertension - c/t Norvasc.   reviewed ekg today.  She is running for exercise regularly.  Add atenolol 25mg  daily. Anxiety - discussed reduction of alcohol, stress reduction, setting goals, consider going back to therapist.   Xanax prn Smoker - discussed risks, advised cessation Hematoma - reassured, advised it would take time to resolve on its own Reviewed 2016 colonoscopy results  Follow up pending labs

## 2015-11-23 NOTE — Addendum Note (Signed)
Addended by: Billie Lade on: 11/23/2015 10:13 AM   Modules accepted: Orders, SmartSet

## 2015-12-07 ENCOUNTER — Other Ambulatory Visit: Payer: Self-pay | Admitting: Medical

## 2016-05-03 ENCOUNTER — Telehealth: Payer: Self-pay | Admitting: Family Medicine

## 2016-05-03 MED ORDER — ALPRAZOLAM 0.5 MG PO TABS: 0.5000 mg | ORAL_TABLET | Freq: Every evening | ORAL | 0 refills | Status: DC | PRN

## 2016-05-03 NOTE — Telephone Encounter (Signed)
Done

## 2016-05-03 NOTE — Telephone Encounter (Signed)
Pt called and wants refill for alprazalom .5 mg to [facility]

## 2016-05-03 NOTE — Telephone Encounter (Signed)
Call out 1 round/30 day supply, no refill

## 2016-05-29 ENCOUNTER — Other Ambulatory Visit: Payer: Self-pay | Admitting: Obstetrics and Gynecology

## 2016-05-29 ENCOUNTER — Other Ambulatory Visit: Payer: Self-pay | Admitting: Medical

## 2016-05-29 DIAGNOSIS — Z1231 Encounter for screening mammogram for malignant neoplasm of breast: Secondary | ICD-10-CM

## 2016-05-29 NOTE — Telephone Encounter (Signed)
PT called the office and scheduled appt for Friday for HTN f/u. Pt notified rx called for 30 days, but should keep appt. [name]

## 2016-06-02 ENCOUNTER — Telehealth: Payer: Self-pay | Admitting: Medical

## 2016-06-02 ENCOUNTER — Encounter: Payer: Self-pay | Admitting: Medical

## 2016-06-02 ENCOUNTER — Ambulatory Visit (INDEPENDENT_AMBULATORY_CARE_PROVIDER_SITE_OTHER): Payer: 59 | Admitting: Medical

## 2016-06-02 VITALS — BP 110/80 | HR 59 | Resp 16 | Ht 67.25 in | Wt 200.4 lb

## 2016-06-02 DIAGNOSIS — F411 Generalized anxiety disorder: Secondary | ICD-10-CM

## 2016-06-02 DIAGNOSIS — G4700 Insomnia, unspecified: Secondary | ICD-10-CM | POA: Diagnosis not present

## 2016-06-02 DIAGNOSIS — I10 Essential (primary) hypertension: Secondary | ICD-10-CM | POA: Diagnosis not present

## 2016-06-02 MED ORDER — ALPRAZOLAM 0.5 MG PO TABS
0.5000 mg | ORAL_TABLET | Freq: Every evening | ORAL | 0 refills | Status: DC | PRN
Start: 2016-06-02 — End: 2017-01-08

## 2016-06-02 MED ORDER — ATENOLOL 25 MG PO TABS: 25.0000 mg | ORAL_TABLET | Freq: Every day | ORAL | 3 refills | Status: DC

## 2016-06-02 NOTE — Progress Notes (Signed)
Subjective; Chief Complaint  Patient presents with  . Hypertension    pt states she just had her GYN exam. BP there was 110/78.    Here for recheck on HTN.  Compliant with medication, BPs have been in normal range.  Recently moved in within boyfriend. They are still getting use to being in the same house.  Has some work stress, has a new boss.  Has worked for 20 years at [employer] for [employer].  Exercise some.  Could do better with diet.  Not drink mcuh alcohol.  Saw gynecology today, has bone density scan scheduled for 06/20/16.  Still smokes about 1pdd.   No new c/o.   Past Medical History:  Diagnosis Date  . Anxiety    GAD  . Depression    in late 90s  . Encounter for routine gynecological examination    Dr. Gaetano Net  . Endometriosis    Dr. Gertie Fey  . Genital herpes    no recent outbreaks, no med  . GERD (gastroesophageal reflux disease)   . Rosanna Randy disease    patient denies   . H/O echocardiogram 06/14/06   LV EF 55-60%, left atrium midly dilated, othewrise normal  . History of constipation    prior with bread/grains, uses miralax prn with good results  . History of EKG 07/19/10   normal EKG  . Hypertension   . Migraine   . Obesity   . Routine gynecological examination    sees gynecology yearly  . Smoker   . Traumatic arthritis of ankle    right -  . Wears glasses    Current Outpatient Prescriptions on File Prior to Visit  Medication Sig Dispense Refill  . ALPRAZolam (XANAX) 0.5 MG tablet Take 1 tablet (0.5 mg total) by mouth at bedtime as needed for anxiety. 30 tablet 0  . amLODipine (NORVASC) 10 MG tablet Take 1 tablet (10 mg total) by mouth daily. 90 tablet 3  . aspirin 81 MG tablet Take 1 tablet (81 mg total) by mouth daily. 90 tablet 3  . atenolol (TENORMIN) 25 MG tablet TAKE ONE TABLET BY MOUTH ONCE DAILY 30 tablet 0  . ibuprofen (ADVIL,MOTRIN) 800 MG tablet Take 1 tablet (800 mg total) by mouth every 8 (eight) hours as needed. (Patient not taking:  Reported on 06/02/2016) 30 tablet 0  . ranitidine (ZANTAC) 150 MG capsule Take 1 capsule (150 mg total) by mouth 2 (two) times daily. 180 capsule 3   No current facility-administered medications on file prior to visit.     ROS as in subjective   Objective: BP 110/80   Pulse (!) 59   Resp 16   Ht 5' 7.25" (1.708 m)   Wt 200 lb 6.4 oz (90.9 kg)   LMP 06/07/2011   SpO2 98%   BMI 31.15 kg/m   Wt Readings from Last 3 Encounters:  06/02/16 200 lb 6.4 oz (90.9 kg)  11/23/15 192 lb (87.1 kg)  06/18/15 193 lb (87.5 kg)   BP Readings from Last 3 Encounters:  06/02/16 110/80  11/23/15 132/90  06/18/15 117/77   Vitals:   06/02/16 1117  BP: 110/80  Pulse: (!) 59  Resp: 16    General appearance: alert, no distress, WD/WN,  Neck: supple, no lymphadenopathy, no thyromegaly, no masses Heart: RRR, normal S1, S2, no murmurs Lungs: CTA bilaterally, no wheezes, rhonchi, or rales Pulses: 2+ symmetric, upper and lower extremities, normal cap refill Ext no edema    Assessment: Encounter Diagnoses  Name Primary?  Marland Kitchen  Essential hypertension Yes  . Anxiety state   . Insomnia, unspecified type     Plan: C/t current medications, doing fine in general.  C/t efforts at exercise, weight loss, and needs to make healthier food choices.  Glad to hear not using as much alcohol as earlier in the year  F/u yearly for physical  Thressa was seen today for hypertension.  Diagnoses and all orders for this visit:  Essential hypertension  Anxiety state  Insomnia, unspecified type  Other orders -     atenolol (TENORMIN) 25 MG tablet; Take 1 tablet (25 mg total) by mouth daily. -     ALPRAZolam (XANAX) 0.5 MG tablet; Take 1 tablet (0.5 mg total) by mouth at bedtime as needed for anxiety.

## 2016-06-02 NOTE — Telephone Encounter (Signed)
Please call out Xanax

## 2016-06-05 NOTE — Telephone Encounter (Signed)
Called in. /RLB  

## 2016-06-07 ENCOUNTER — Ambulatory Visit
Admission: RE | Admit: 2016-06-07 | Discharge: 2016-06-07 | Disposition: A | Discharge status: Home / Self Care | Payer: 59 | Source: Ambulatory Visit | Attending: Obstetrics and Gynecology | Admitting: Obstetrics and Gynecology

## 2016-06-07 DIAGNOSIS — Z1231 Encounter for screening mammogram for malignant neoplasm of breast: Secondary | ICD-10-CM

## 2016-11-15 ENCOUNTER — Other Ambulatory Visit: Payer: Self-pay

## 2016-11-15 MED ORDER — AMLODIPINE BESYLATE 10 MG PO TABS: 10.0000 mg | ORAL_TABLET | Freq: Every day | ORAL | 3 refills | Status: DC

## 2017-01-02 ENCOUNTER — Other Ambulatory Visit: Payer: Self-pay | Admitting: Medical

## 2017-01-02 ENCOUNTER — Telehealth: Payer: Self-pay | Admitting: Medical

## 2017-01-02 NOTE — Telephone Encounter (Signed)
Per shane she will need to see first, called pt moved up her appt.

## 2017-01-02 NOTE — Telephone Encounter (Signed)
Pt called and made an appt for end of June. Please refill meds.

## 2017-01-08 ENCOUNTER — Ambulatory Visit (INDEPENDENT_AMBULATORY_CARE_PROVIDER_SITE_OTHER): Payer: 59 | Admitting: Medical

## 2017-01-08 ENCOUNTER — Encounter: Payer: Self-pay | Admitting: Medical

## 2017-01-08 VITALS — BP 110/74 | HR 81 | Wt 197.0 lb

## 2017-01-08 DIAGNOSIS — E669 Obesity, unspecified: Secondary | ICD-10-CM

## 2017-01-08 DIAGNOSIS — Z6830 Body mass index (BMI) 30.0-30.9, adult: Secondary | ICD-10-CM

## 2017-01-08 DIAGNOSIS — R5383 Other fatigue: Secondary | ICD-10-CM | POA: Insufficient documentation

## 2017-01-08 DIAGNOSIS — Z800 Family history of malignant neoplasm of digestive organs: Secondary | ICD-10-CM

## 2017-01-08 DIAGNOSIS — Z7189 Other specified counseling: Secondary | ICD-10-CM

## 2017-01-08 DIAGNOSIS — G4700 Insomnia, unspecified: Secondary | ICD-10-CM

## 2017-01-08 DIAGNOSIS — I10 Essential (primary) hypertension: Secondary | ICD-10-CM | POA: Diagnosis not present

## 2017-01-08 DIAGNOSIS — F411 Generalized anxiety disorder: Secondary | ICD-10-CM

## 2017-01-08 DIAGNOSIS — Z7185 Encounter for immunization safety counseling: Secondary | ICD-10-CM | POA: Insufficient documentation

## 2017-01-08 DIAGNOSIS — K219 Gastro-esophageal reflux disease without esophagitis: Secondary | ICD-10-CM | POA: Insufficient documentation

## 2017-01-08 LAB — CBC
HCT: 45.2 % — ABNORMAL HIGH (ref 35.0–45.0)
HEMOGLOBIN: 15.4 g/dL (ref 11.7–15.5)
MCH: 32 pg (ref 27.0–33.0)
MCHC: 34.1 g/dL (ref 32.0–36.0)
MCV: 94 fL (ref 80.0–100.0)
MPV: 9.3 fL (ref 7.5–12.5)
Platelets: 289 10*3/uL (ref 140–400)
RBC: 4.81 MIL/uL (ref 3.80–5.10)
RDW: 13.2 % (ref 11.0–15.0)
WBC: 8.4 10*3/uL (ref 4.0–10.5)

## 2017-01-08 MED ORDER — AMLODIPINE BESYLATE 10 MG PO TABS: 10.0000 mg | ORAL_TABLET | Freq: Every day | ORAL | 3 refills | Status: DC

## 2017-01-08 MED ORDER — RANITIDINE HCL 150 MG PO CAPS
150.0000 mg | ORAL_CAPSULE | Freq: Two times a day (BID) | ORAL | 3 refills | Status: DC
Start: 2017-01-08 — End: 2017-12-19

## 2017-01-08 MED ORDER — ATENOLOL 25 MG PO TABS: 25.0000 mg | ORAL_TABLET | Freq: Every day | ORAL | 3 refills | Status: DC

## 2017-01-08 MED ORDER — ASPIRIN 81 MG PO TABS: 81.0000 mg | ORAL_TABLET | Freq: Every day | ORAL | 3 refills | Status: DC

## 2017-01-08 MED ORDER — ALPRAZOLAM 0.5 MG PO TABS: 0.5000 mg | ORAL_TABLET | Freq: Every evening | ORAL | 1 refills | Status: DC | PRN

## 2017-01-08 NOTE — Progress Notes (Signed)
Subjective: Chief Complaint  Patient presents with  . med check    med check    Here for routine med check.  Just bought a house, moved in this weekend.    Has several bruises from moving furniture and boxes  She does note fatigue,   Snores.  Denies morning fatigue.  Sometimes has daytime somnolence.   No family hx/o sleep apnea.    Compliant with BP medication without c/o  compliant with GERD medication  Uses xanax occasionally for anxiety or sleep.    Sees gyn for routine gynecological care  Past Medical History:  Diagnosis Date  . Anxiety    GAD  . Depression    in late 90s  . Encounter for routine gynecological examination    Dr. Gaetano Net  . Endometriosis    Dr. Gertie Fey  . Genital herpes    no recent outbreaks, no med  . GERD (gastroesophageal reflux disease)   . Rosanna Randy disease    patient denies   . H/O echocardiogram 06/14/06   LV EF 55-60%, left atrium midly dilated, othewrise normal  . History of constipation    prior with bread/grains, uses miralax prn with good results  . History of EKG 07/19/10   normal EKG  . Hypertension   . Migraine   . Obesity   . Routine gynecological examination    sees gynecology yearly  . Smoker   . Traumatic arthritis of ankle    right -  . Wears glasses    Current Outpatient Prescriptions on File Prior to Visit  Medication Sig Dispense Refill  . ibuprofen (ADVIL,MOTRIN) 800 MG tablet Take 1 tablet (800 mg total) by mouth every 8 (eight) hours as needed. 30 tablet 0  . Prenatal Vit-Fe Fumarate-FA (PRENATAL MULTIVITAMIN) TABS tablet Take 1 tablet by mouth daily at 12 noon.     No current facility-administered medications on file prior to visit.    ROS as in subjective   Objective: BP 110/74   Pulse 81   Wt 197 lb (89.4 kg)   LMP 06/07/2011   SpO2 99%   BMI 30.63 kg/m   Wt Readings from Last 3 Encounters:  01/08/17 197 lb (89.4 kg)  06/02/16 200 lb 6.4 oz (90.9 kg)  11/23/15 192 lb (87.1 kg)    General  appearance: alert, no distress, WD/WN, white female Scattered small bruises of right elbow and bilat thighs she attributes to recently moving furniture and boxes HEENT: normocephalic, sclerae anicteric, PERRLA, EOMi, nares patent, no discharge or erythema, pharynx normal Oral cavity: MMM, no lesions, no small airway or enlarged tonsils Neck: supple, no lymphadenopathy, no thyromegaly, no masses Heart: RRR, normal S1, S2, no murmurs Lungs: CTA bilaterally, no wheezes, rhonchi, or rales Abdomen: +bs, soft, non tender, non distended, no masses, no hepatomegaly, no splenomegaly Extremities: no edema, no cyanosis, no clubbing Pulses: 2+ symmetric, upper and lower extremities, normal cap refill Neurological: alert, oriented x 3, CN2-12 intact, strength normal upper extremities and lower extremities, sensation normal throughout, DTRs 2+ throughout, no cerebellar signs, gait normal Psychiatric: normal affect, behavior normal, pleasant     Assessment: Encounter Diagnoses  Name Primary?  . Essential hypertension Yes  . Insomnia, unspecified type   . Other fatigue   . Class 1 obesity with serious comorbidity and body mass index (BMI) of 30.0 to 30.9 in adult, unspecified obesity type   . Anxiety state   . Family history colon cancer - brother at 57 dx   . Vaccine counseling   .  GERD without esophagitis     Plan: Routine labs today Counseled on diet, exercise, need for weight loss Consider sleep study pending labs, given fatigue, obesity, prior sleep problems Refilled xanax for prn use for anxiety, sometimes for sleep C/t same medications for BP, GERD   Stephanny was seen today for med check.  Diagnoses and all orders for this visit:  Essential hypertension -     Comprehensive metabolic panel -     Hemoglobin A1c -     CBC -     Lipid panel  Insomnia, unspecified type  Other fatigue -     Comprehensive metabolic panel -     CBC -     VITAMIN D 25 Hydroxy (Vit-D Deficiency,  Fractures)  Class 1 obesity with serious comorbidity and body mass index (BMI) of 30.0 to 30.9 in adult, unspecified obesity type  Anxiety state  Family history colon cancer - brother at 47 dx  Vaccine counseling  GERD without esophagitis  Other orders -     amLODipine (NORVASC) 10 MG tablet; Take 1 tablet (10 mg total) by mouth daily. -     ranitidine (ZANTAC) 150 MG capsule; Take 1 capsule (150 mg total) by mouth 2 (two) times daily. -     atenolol (TENORMIN) 25 MG tablet; Take 1 tablet (25 mg total) by mouth daily. -     aspirin 81 MG tablet; Take 1 tablet (81 mg total) by mouth daily. -     ALPRAZolam (XANAX) 0.5 MG tablet; Take 1 tablet (0.5 mg total) by mouth at bedtime as needed for anxiety.

## 2017-01-08 NOTE — Patient Instructions (Signed)
Recommendations  Work on getting back to a health eating plan  Exercise daily for at least 30 minutes  Try to lose 15 lb in the next 3 months.   Set some pant size or other fitness goals  If your labs come back normal, consider sleep study to check /screen for sleep apnea  I recommend you have a shingles vaccine to help prevent shingles or herpes zoster outbreak.   Please call your insurer to inquire about coverage for the Shingrix vaccine given in 2 doses.   Some insurers cover this vaccine after age 54, some cover this after age 54.  If your insurer covers this, then call to schedule appointment to have this vaccine here.

## 2017-01-09 LAB — COMPREHENSIVE METABOLIC PANEL
ALBUMIN: 4.3 g/dL (ref 3.6–5.1)
ALT: 13 U/L (ref 6–29)
AST: 19 U/L (ref 10–35)
Alkaline Phosphatase: 72 U/L (ref 33–130)
BUN: 20 mg/dL (ref 7–25)
CALCIUM: 9.7 mg/dL (ref 8.6–10.4)
CHLORIDE: 107 mmol/L (ref 98–110)
CO2: 25 mmol/L (ref 20–31)
Creat: 1.03 mg/dL (ref 0.50–1.05)
Glucose, Bld: 80 mg/dL (ref 65–99)
Potassium: 4.6 mmol/L (ref 3.5–5.3)
SODIUM: 141 mmol/L (ref 135–146)
Total Bilirubin: 0.9 mg/dL (ref 0.2–1.2)
Total Protein: 7.5 g/dL (ref 6.1–8.1)

## 2017-01-09 LAB — VITAMIN D 25 HYDROXY (VIT D DEFICIENCY, FRACTURES): Vit D, 25-Hydroxy: 35 ng/mL (ref 30–100)

## 2017-01-09 LAB — LIPID PANEL
CHOL/HDL RATIO: 1.7 ratio (ref <5.0)
CHOLESTEROL: 167 mg/dL (ref <200)
HDL: 98 mg/dL (ref >50)
LDL Cholesterol: 54 mg/dL (ref <100)
TRIGLYCERIDES: 77 mg/dL (ref <150)
VLDL: 15 mg/dL (ref <30)

## 2017-01-09 LAB — HEMOGLOBIN A1C
Hgb A1c MFr Bld: 5 % (ref <5.7)
MEAN PLASMA GLUCOSE: 97 mg/dL

## 2017-01-31 ENCOUNTER — Encounter: Payer: 59 | Admitting: Medical

## 2017-04-02 ENCOUNTER — Ambulatory Visit (INDEPENDENT_AMBULATORY_CARE_PROVIDER_SITE_OTHER): Payer: 59 | Admitting: Family Medicine

## 2017-04-02 VITALS — BP 120/80 | HR 70 | Temp 98.0°F | Wt 205.0 lb

## 2017-04-02 DIAGNOSIS — J029 Acute pharyngitis, unspecified: Secondary | ICD-10-CM | POA: Diagnosis not present

## 2017-04-02 DIAGNOSIS — J039 Acute tonsillitis, unspecified: Secondary | ICD-10-CM

## 2017-04-02 LAB — POCT RAPID STREP A (OFFICE): RAPID STREP A SCREEN: NEGATIVE

## 2017-04-02 NOTE — Progress Notes (Signed)
   Subjective:    Patient ID: [name], female    DOB: 16-Jul-1961, 56 y.o.   MRN: [id number]  HPI She complains of a three-day history of sore throat and slight cough with fatigue but no fever, chills, earache. She has a previous history of mono.  Review of Systems     Objective:   Physical Exam Alert and in no distress. Tympanic membranes and canals are normal. Tonsils are red, swollen with exudates. Neck is supple without adenopathy or thyromegaly. Cardiac exam shows a regular sinus rhythm without murmurs or gallops. Lungs are clear to auscultation. Strep screen negative       Assessment & Plan:  Tonsillitis  Sore throat - Plan: POCT rapid strep A  Recommend supportive care. If her symptoms persist, she will return for possible mono test.

## 2017-05-08 ENCOUNTER — Other Ambulatory Visit: Payer: Self-pay | Admitting: Obstetrics and Gynecology

## 2017-05-08 DIAGNOSIS — Z1231 Encounter for screening mammogram for malignant neoplasm of breast: Secondary | ICD-10-CM

## 2017-06-26 ENCOUNTER — Ambulatory Visit
Admission: RE | Admit: 2017-06-26 | Discharge: 2017-06-26 | Disposition: A | Discharge status: Home / Self Care | Payer: 59 | Source: Ambulatory Visit | Attending: Obstetrics and Gynecology | Admitting: Obstetrics and Gynecology

## 2017-06-26 DIAGNOSIS — Z1231 Encounter for screening mammogram for malignant neoplasm of breast: Secondary | ICD-10-CM | POA: Diagnosis not present

## 2017-07-04 DIAGNOSIS — Z01419 Encounter for gynecological examination (general) (routine) without abnormal findings: Secondary | ICD-10-CM | POA: Diagnosis not present

## 2017-07-04 LAB — HM PAP SMEAR: HM Pap smear: NEGATIVE

## 2017-08-07 DIAGNOSIS — M810 Age-related osteoporosis without current pathological fracture: Secondary | ICD-10-CM

## 2017-08-07 HISTORY — DX: Age-related osteoporosis without current pathological fracture: M81.0

## 2017-09-07 IMAGING — MG DIGITAL SCREENING BILATERAL MAMMOGRAM WITH CAD
4 series · 4 of 4 positions shown · non-contrast
Comparison: Previous exam(s).

CLINICAL DATA: Screening.

EXAM:
DIGITAL SCREENING BILATERAL MAMMOGRAM WITH CAD

[R MLO]
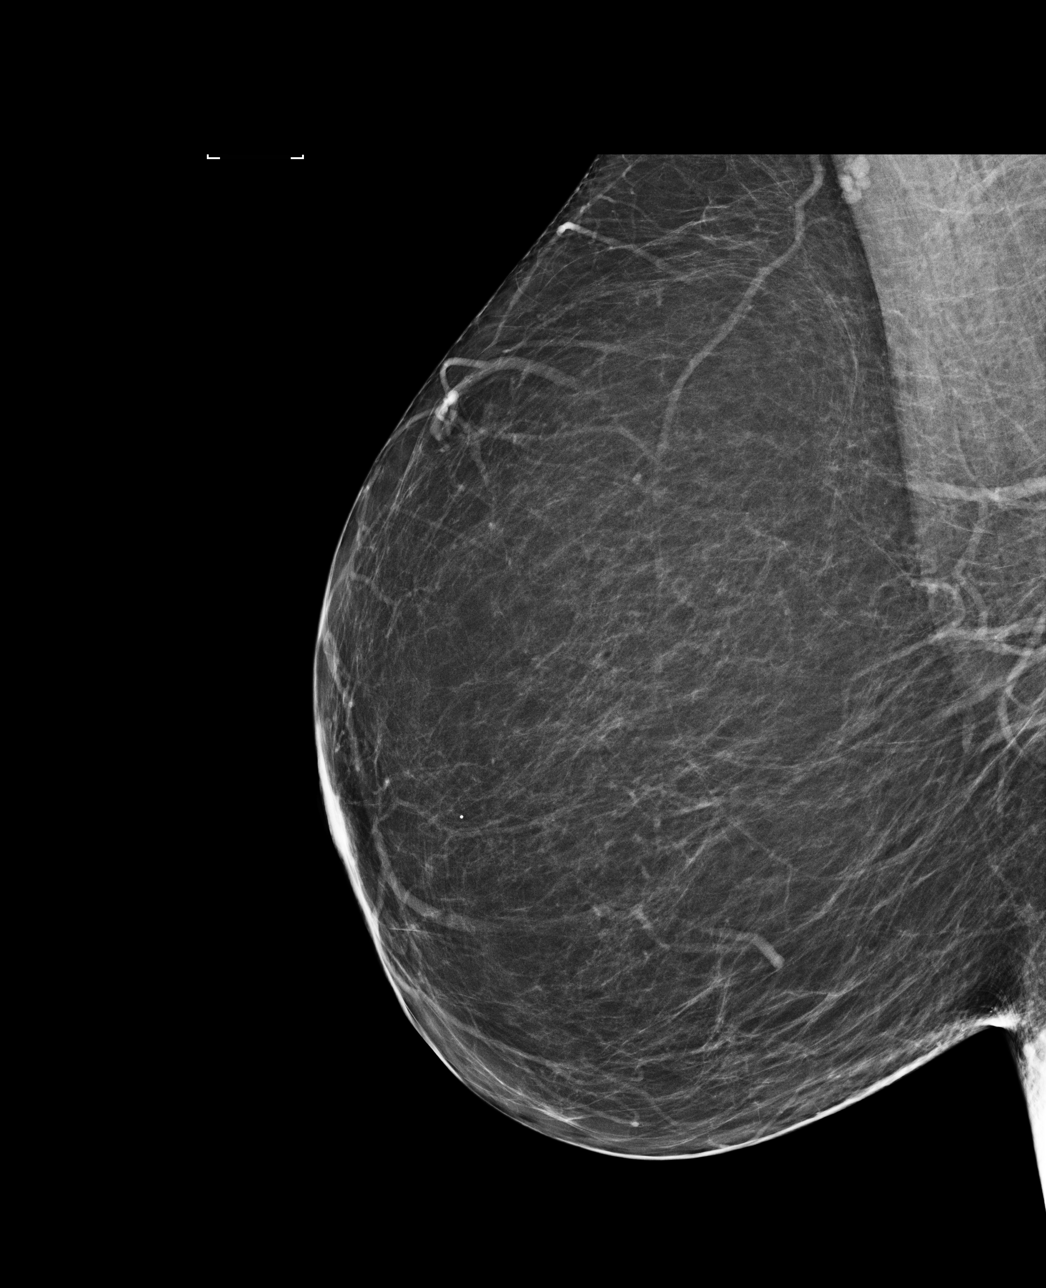

[L MLO]
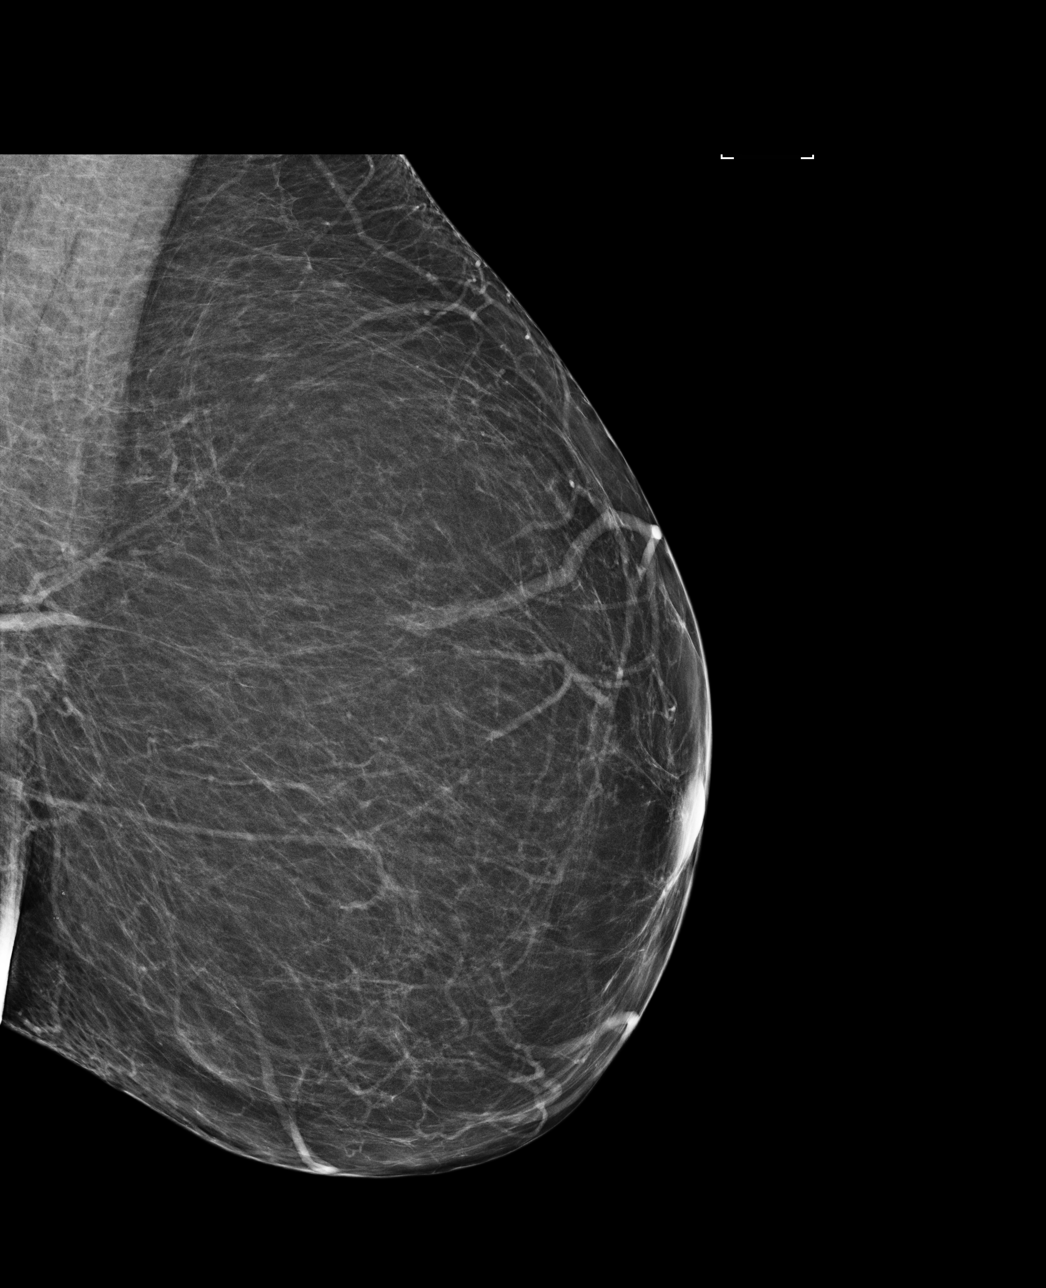

[R CC]
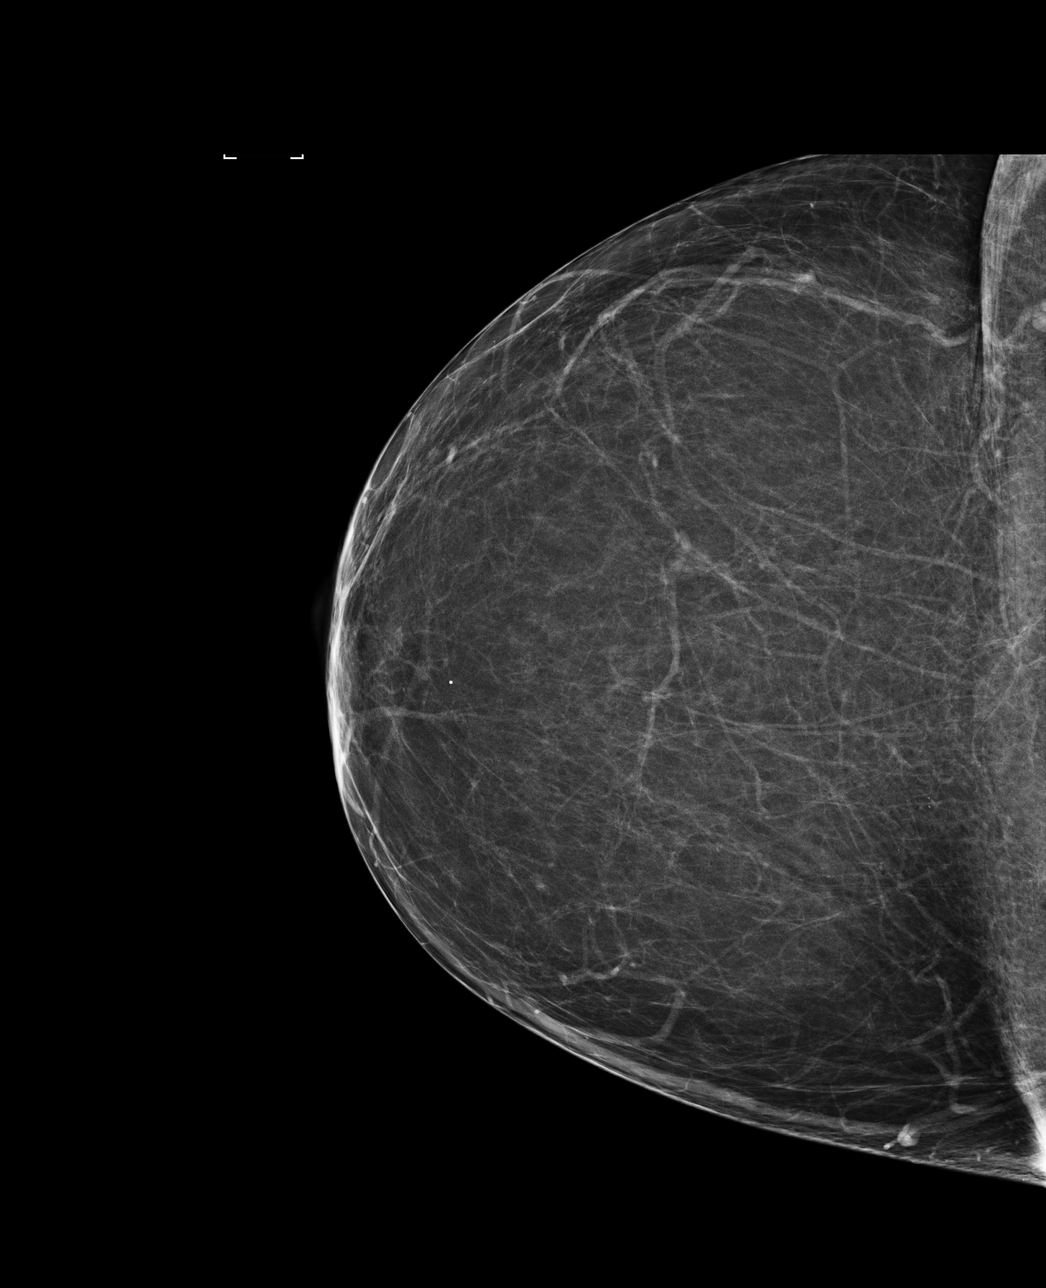

[L CC]
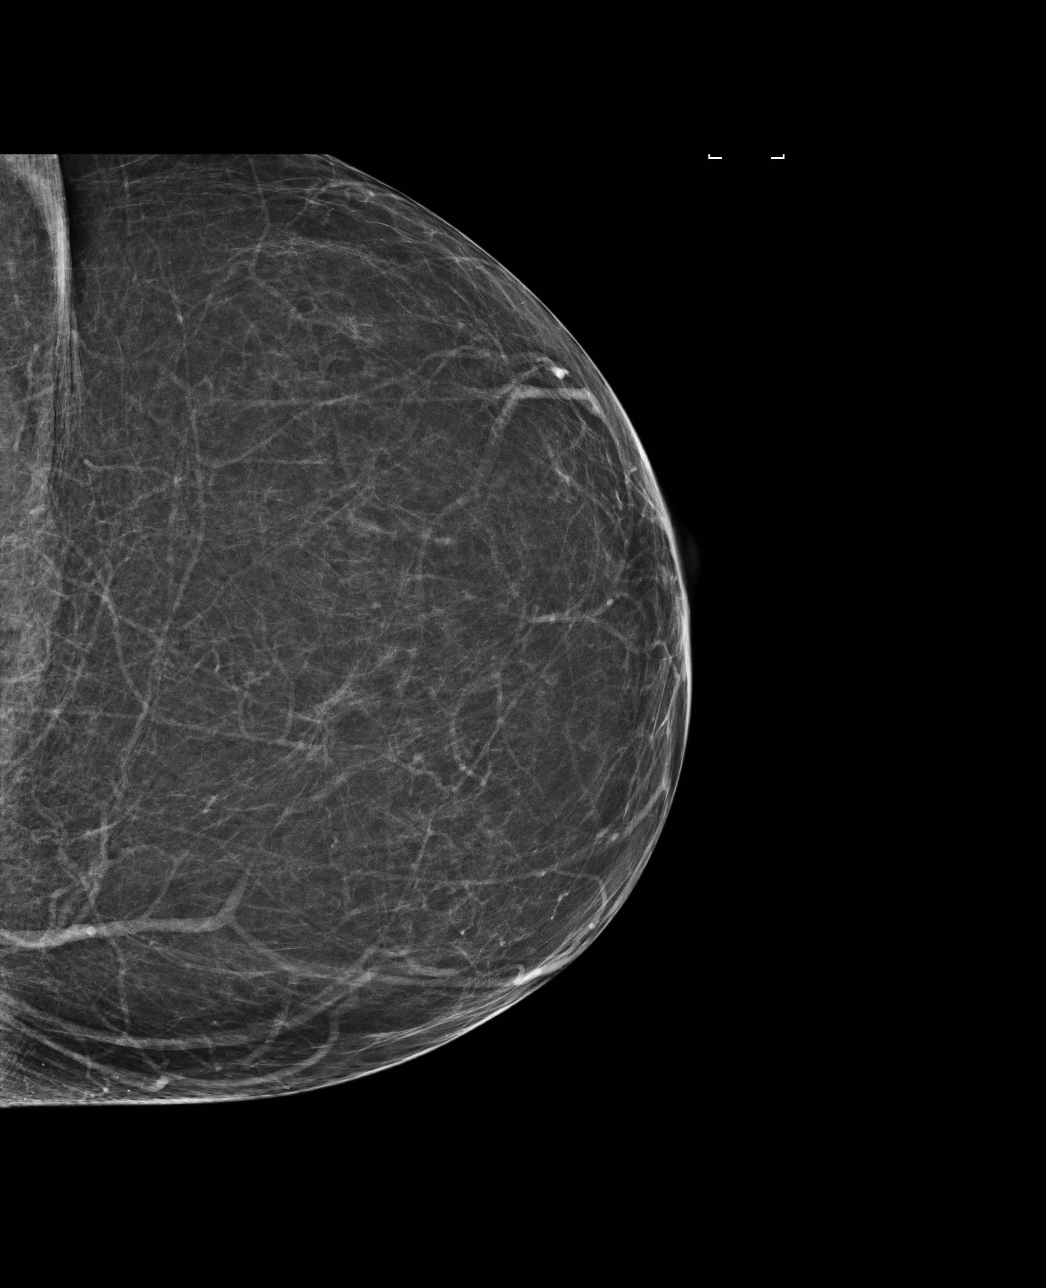

[4 of 4 positions shown; findings below may reference images not displayed]

ACR Breast Density Category b: There are scattered areas of
fibroglandular density.
FINDINGS: There are no findings suspicious for malignancy. Images were
processed with CAD.
IMPRESSION: No mammographic evidence of malignancy. A result letter of this
screening mammogram will be mailed directly to the patient.

RECOMMENDATION:
Screening mammogram in one year. (Code:AS-G-LCT)

BI-RADS CATEGORY  1: Negative.

## 2017-10-24 DIAGNOSIS — H9313 Tinnitus, bilateral: Secondary | ICD-10-CM | POA: Diagnosis not present

## 2017-10-24 DIAGNOSIS — H903 Sensorineural hearing loss, bilateral: Secondary | ICD-10-CM | POA: Diagnosis not present

## 2017-12-19 ENCOUNTER — Encounter: Payer: Self-pay | Admitting: Medical

## 2017-12-19 ENCOUNTER — Ambulatory Visit: Payer: 59 | Admitting: Medical

## 2017-12-19 VITALS — BP 120/82 | HR 69 | Temp 98.2°F | Ht 66.5 in | Wt 214.6 lb

## 2017-12-19 DIAGNOSIS — K219 Gastro-esophageal reflux disease without esophagitis: Secondary | ICD-10-CM | POA: Diagnosis not present

## 2017-12-19 DIAGNOSIS — Z800 Family history of malignant neoplasm of digestive organs: Secondary | ICD-10-CM

## 2017-12-19 DIAGNOSIS — E669 Obesity, unspecified: Secondary | ICD-10-CM | POA: Diagnosis not present

## 2017-12-19 DIAGNOSIS — I10 Essential (primary) hypertension: Secondary | ICD-10-CM | POA: Diagnosis not present

## 2017-12-19 DIAGNOSIS — F411 Generalized anxiety disorder: Secondary | ICD-10-CM | POA: Diagnosis not present

## 2017-12-19 DIAGNOSIS — Z6830 Body mass index (BMI) 30.0-30.9, adult: Secondary | ICD-10-CM | POA: Diagnosis not present

## 2017-12-19 MED ORDER — AMLODIPINE BESYLATE 10 MG PO TABS: 10.0000 mg | ORAL_TABLET | Freq: Every day | ORAL | 3 refills | Status: DC

## 2017-12-19 MED ORDER — HYDROCHLOROTHIAZIDE 12.5 MG PO TABS: 12.5000 mg | ORAL_TABLET | Freq: Every day | ORAL | 2 refills | Status: DC

## 2017-12-19 MED ORDER — ALPRAZOLAM 0.5 MG PO TABS: 0.5000 mg | ORAL_TABLET | Freq: Every evening | ORAL | 1 refills | Status: DC | PRN

## 2017-12-19 MED ORDER — RANITIDINE HCL 150 MG PO CAPS: 150.0000 mg | ORAL_CAPSULE | Freq: Two times a day (BID) | ORAL | 3 refills | Status: DC

## 2017-12-19 MED ORDER — ATENOLOL 25 MG PO TABS
25.0000 mg | ORAL_TABLET | Freq: Every day | ORAL | 3 refills | Status: DC
Start: 2017-12-19 — End: 2018-12-13

## 2017-12-19 NOTE — Progress Notes (Signed)
Subjective: Chief Complaint  Patient presents with  . Medication Management   Got married 09/21/17  HTN - compliant with Amlodipine 10mg , Atenolol 25mg  daily, takes aspirin daily for heart health.   GERD - taking 150mg  on a regular basis but not always every day BID.  Takes at least daily in the morning.    Avoids triggers.   Uses Ibuprofen rarely.  In the past used for headaches or back pain, but hasn't been dealing with this lately  Still smoking, varies from day to day due to stress levels.  Quit for 11 years at one point, but been back smoking 3 years this time.    Drinking a bottle of wine per night.  Exercising regularly, more weights of late, less cardio.   exercises every other day.  Past Medical History:  Diagnosis Date  . Anxiety    GAD  . Depression    in late 90s  . Encounter for routine gynecological examination    Dr. Gaetano Net  . Endometriosis    Dr. Gertie Fey  . Genital herpes    no recent outbreaks, no med  . GERD (gastroesophageal reflux disease)   . Rosanna Randy disease    patient denies   . H/O echocardiogram 06/14/06   LV EF 55-60%, left atrium midly dilated, othewrise normal  . History of constipation    prior with bread/grains, uses miralax prn with good results  . History of EKG 07/19/10   normal EKG  . Hypertension   . Migraine   . Obesity   . Routine gynecological examination    sees gynecology yearly  . Smoker   . Traumatic arthritis of ankle    right -  . Wears glasses    Current Outpatient Medications on File Prior to Visit  Medication Sig Dispense Refill  . aspirin 81 MG tablet Take 1 tablet (81 mg total) by mouth daily. 90 tablet 3  . ibuprofen (ADVIL,MOTRIN) 800 MG tablet Take 1 tablet (800 mg total) by mouth every 8 (eight) hours as needed. 30 tablet 0   No current facility-administered medications on file prior to visit.    ROS as in subjective      Objective: BP 120/82   Pulse 69   Temp 98.2 F (36.8 C) (Oral)   Ht 5' 6.5"  (1.689 m)   Wt 214 lb 9.6 oz (97.3 kg)   LMP 06/07/2011   SpO2 98%   BMI 34.12 kg/m    BP Readings from Last 3 Encounters:  12/19/17 120/82  04/02/17 120/80  01/08/17 110/74    Wt Readings from Last 3 Encounters:  12/19/17 214 lb 9.6 oz (97.3 kg)  04/02/17 205 lb (93 kg)  01/08/17 197 lb (89.4 kg)    General appearance: alert, no distress, WD/WN,  Neck: supple, no lymphadenopathy, no thyromegaly, no masses, no bruits Heart: RRR, normal S1, S2, no murmurs Lungs: CTA bilaterally, no wheezes, rhonchi, or rales Pulses: 2+ symmetric, upper and lower extremities, normal cap refill Ext: 1+ nonpitting edema     Assessment: Encounter Diagnoses  Name Primary?  . Essential hypertension Yes  . GERD without esophagitis   . Anxiety state   . Family history of colon cancer   . Class 1 obesity with serious comorbidity and body mass index (BMI) of 30.0 to 30.9 in adult, unspecified obesity type     Plan: Refills today, reviewed med history, compliance. Begin trial of prn HCTZ for edema as discussed Counseled on diet, exercise, and recommended she cut back  on alcohol due to long term health risks Advised smoking cessation.  She is not ready to quit. Stool cards x 3 went.  reviewed 2016 colonoscopy report F/u with gynecology as scheduled for routine care  [name] was seen today for medication management.  Diagnoses and all orders for this visit:  Essential hypertension -     Hemoglobin A1c -     Comprehensive metabolic panel  GERD without esophagitis  Anxiety state  Family history of colon cancer  Class 1 obesity with serious comorbidity and body mass index (BMI) of 30.0 to 30.9 in adult, unspecified obesity type  Other orders -     ALPRAZolam (XANAX) 0.5 MG tablet; Take 1 tablet (0.5 mg total) by mouth at bedtime as needed for anxiety. -     atenolol (TENORMIN) 25 MG tablet; Take 1 tablet (25 mg total) by mouth daily. -     amLODipine (NORVASC) 10 MG tablet; Take 1  tablet (10 mg total) by mouth daily. -     ranitidine (ZANTAC) 150 MG capsule; Take 1 capsule (150 mg total) by mouth 2 (two) times daily. -     hydrochlorothiazide (HYDRODIURIL) 12.5 MG tablet; Take 1 tablet (12.5 mg total) by mouth daily.

## 2017-12-24 ENCOUNTER — Other Ambulatory Visit: Payer: Self-pay | Admitting: Family Medicine

## 2017-12-24 DIAGNOSIS — I10 Essential (primary) hypertension: Secondary | ICD-10-CM | POA: Diagnosis not present

## 2017-12-25 LAB — COMPLETE METABOLIC PANEL WITH GFR
AG Ratio: 1.2 (calc) (ref 1.0–2.5)
ALBUMIN MSPROF: 3.8 g/dL (ref 3.6–5.1)
ALT: 13 U/L (ref 6–29)
AST: 16 U/L (ref 10–35)
Alkaline phosphatase (APISO): 60 U/L (ref 33–130)
BILIRUBIN TOTAL: 0.6 mg/dL (ref 0.2–1.2)
BUN: 18 mg/dL (ref 7–25)
CHLORIDE: 105 mmol/L (ref 98–110)
CO2: 28 mmol/L (ref 20–32)
CREATININE: 0.95 mg/dL (ref 0.50–1.05)
Calcium: 9.6 mg/dL (ref 8.6–10.4)
GFR, EST AFRICAN AMERICAN: 79 mL/min/{1.73_m2} (ref >=60)
GFR, Est Non African American: 68 mL/min/{1.73_m2} (ref >=60)
GLUCOSE: 106 mg/dL — AB (ref 65–99)
Globulin: 3.1 g/dL (calc) (ref 1.9–3.7)
Potassium: 5.1 mmol/L (ref 3.5–5.3)
Sodium: 140 mmol/L (ref 135–146)
TOTAL PROTEIN: 6.9 g/dL (ref 6.1–8.1)

## 2017-12-25 LAB — HEMOGLOBIN A1C
Hgb A1c MFr Bld: 5 % of total Hgb (ref <5.7)
Mean Plasma Glucose: 97 (calc)
eAG (mmol/L): 5.4 (calc)

## 2018-06-07 ENCOUNTER — Other Ambulatory Visit: Payer: Self-pay | Admitting: Obstetrics and Gynecology

## 2018-06-07 DIAGNOSIS — Z1231 Encounter for screening mammogram for malignant neoplasm of breast: Secondary | ICD-10-CM

## 2018-06-12 ENCOUNTER — Telehealth: Payer: Self-pay | Admitting: Medical

## 2018-06-12 ENCOUNTER — Other Ambulatory Visit: Payer: Self-pay | Admitting: Medical

## 2018-06-12 MED ORDER — OMEPRAZOLE 20 MG PO CPDR
20.0000 mg | DELAYED_RELEASE_CAPSULE | Freq: Every day | ORAL | 2 refills | Status: DC
Start: 2018-06-12 — End: 2018-09-09

## 2018-06-12 NOTE — Telephone Encounter (Signed)
  Please call  Patient received letter from [place] that her ranitidine has been recalled and to call us to get different RX

## 2018-06-12 NOTE — Telephone Encounter (Signed)
Given the recent recall issues with Ranitidine/Zantac, I recommend the following:  Avoid acid reflux issues by avoiding acidic foods (oranges, orange juice, lemonade, citrus, tomatoes, salsa, ketchup, etc.), avoid hot sauce, spicy foods, peppers, avoid eating within 2 hours of bedtime, and eat slowly chewing food thoroughly  If you still feel you need to take something to reduce acid reflux, or if your gastroenterologist has advised long term therapy with acid reducers, we could switch you to Omeprazole/Prilosec daily in the morning 45 minutes prior to eating breakfast  Let me know if I need to send prescription for Omeprazole

## 2018-06-14 NOTE — Telephone Encounter (Signed)
Left message on voicemail for patient to call back. 

## 2018-06-14 NOTE — Telephone Encounter (Signed)
Patient has been informed of provider note. She picked of Omeprazole yesterday from the pharmacy. Patient has her pharmacy updated the one in [facility].

## 2018-07-16 DIAGNOSIS — Z01419 Encounter for gynecological examination (general) (routine) without abnormal findings: Secondary | ICD-10-CM | POA: Diagnosis not present

## 2018-07-16 DIAGNOSIS — Z6833 Body mass index (BMI) 33.0-33.9, adult: Secondary | ICD-10-CM | POA: Diagnosis not present

## 2018-07-16 LAB — HM DEXA SCAN

## 2018-07-17 ENCOUNTER — Ambulatory Visit
Admission: RE | Admit: 2018-07-17 | Discharge: 2018-07-17 | Disposition: A | Discharge status: Home / Self Care | Payer: 59 | Source: Ambulatory Visit | Attending: Obstetrics and Gynecology | Admitting: Obstetrics and Gynecology

## 2018-07-17 DIAGNOSIS — Z1231 Encounter for screening mammogram for malignant neoplasm of breast: Secondary | ICD-10-CM | POA: Diagnosis not present

## 2018-07-17 LAB — HM MAMMOGRAPHY

## 2018-08-12 ENCOUNTER — Ambulatory Visit: Payer: 59 | Admitting: Medical

## 2018-08-12 VITALS — BP 124/76 | HR 77 | Temp 98.4°F | Resp 16 | Ht 67.0 in | Wt 220.2 lb

## 2018-08-12 DIAGNOSIS — M778 Other enthesopathies, not elsewhere classified: Secondary | ICD-10-CM

## 2018-08-12 DIAGNOSIS — H109 Unspecified conjunctivitis: Secondary | ICD-10-CM | POA: Diagnosis not present

## 2018-08-12 DIAGNOSIS — M7711 Lateral epicondylitis, right elbow: Secondary | ICD-10-CM | POA: Diagnosis not present

## 2018-08-12 DIAGNOSIS — R2232 Localized swelling, mass and lump, left upper limb: Secondary | ICD-10-CM

## 2018-08-12 DIAGNOSIS — M779 Enthesopathy, unspecified: Secondary | ICD-10-CM | POA: Diagnosis not present

## 2018-08-12 MED ORDER — NAPROXEN 500 MG PO TABS: 500.0000 mg | ORAL_TABLET | Freq: Two times a day (BID) | ORAL | 0 refills | Status: DC

## 2018-08-12 MED ORDER — POLYMYXIN B-TRIMETHOPRIM 10000-0.1 UNIT/ML-% OP SOLN: 1.0000 [drp] | Freq: Four times a day (QID) | OPHTHALMIC | 0 refills | Status: DC

## 2018-08-12 NOTE — Progress Notes (Signed)
Subjective: [name] is a 58 y.o. female who presents for possible pink eye.   Here for 3 day hx/o pink and crusting eye on the right.  Has had some itching, awoke matted the last 2 days.  Has stayed in 2 hotels recently.  No sick contacts with same.   No eye pain.     She has concerns about right hand pain for a month, right elbow for a few months, and knots in left hand in recent weeks.  She notes pain in right thumb down to wrist.  Is right handed. Denies injury.   Types all day long.   Using OTC splint for right hand x 1 week, intermittent.    No other aggravating or relieving factors.  No other c/o.  Past Medical History:  Diagnosis Date  . Anxiety    GAD  . Depression    in late 90s  . Encounter for routine gynecological examination    Dr. Gaetano Net  . Endometriosis    Dr. Gertie Fey  . Genital herpes    no recent outbreaks, no med  . GERD (gastroesophageal reflux disease)   . Rosanna Randy disease    patient denies   . H/O echocardiogram 06/14/06   LV EF 55-60%, left atrium midly dilated, othewrise normal  . History of constipation    prior with bread/grains, uses miralax prn with good results  . History of EKG 07/19/10   normal EKG  . Hypertension   . Migraine   . Obesity   . Routine gynecological examination    sees gynecology yearly  . Smoker   . Traumatic arthritis of ankle    right -  . Wears glasses    Current Outpatient Medications on File Prior to Visit  Medication Sig Dispense Refill  . ALPRAZolam (XANAX) 0.5 MG tablet Take 1 tablet (0.5 mg total) by mouth at bedtime as needed for anxiety. 30 tablet 1  . amLODipine (NORVASC) 10 MG tablet Take 1 tablet (10 mg total) by mouth daily. 90 tablet 3  . aspirin 81 MG tablet Take 1 tablet (81 mg total) by mouth daily. 90 tablet 3  . atenolol (TENORMIN) 25 MG tablet Take 1 tablet (25 mg total) by mouth daily. 90 tablet 3  . ibuprofen (ADVIL,MOTRIN) 800 MG tablet Take 1 tablet (800 mg total) by mouth every 8 (eight)  hours as needed. 30 tablet 0  . omeprazole (PRILOSEC) 20 MG capsule Take 1 capsule (20 mg total) by mouth daily. 30 capsule 2  . hydrochlorothiazide (HYDRODIURIL) 12.5 MG tablet Take 1 tablet (12.5 mg total) by mouth daily. (Patient not taking: Reported on 08/12/2018) 30 tablet 2   No current facility-administered medications on file prior to visit.     ROS as in subjective   Objective: BP 124/76   Pulse 77   Temp 98.4 F (36.9 C) (Oral)   Resp 16   Ht 5\' 7"  (1.702 m)   Wt 220 lb 3.2 oz (99.9 kg)   LMP 06/07/2011   SpO2 98%   BMI 34.49 kg/m   General appearance: alert, no distress Bilat conjunctiva erythematous and injected, some crusting, but otherwise PERRLA, EOMi, no other abnormality PERRLA, EOMi, eyes otherwise unremarkable Otherwise HENT unremarkable MSK:  Tender over MCPs of right thumb and 2nd finger, pain with resisted thumb and 2nd finger extension, puffy swelling of space between right thumb and 2nd fingers, tender over right lateral epicondyle.  Left hand with nodule palpable and somewhat tender of volar hand along distal  MCP region of 3rd and 4th fingers Arms/hands neurovascularly intact       Assessment  Encounter Diagnoses  Name Primary?  . Conjunctivitis of both eyes, unspecified conjunctivitis type Yes  . Hand tendonitis   . Lateral epicondylitis of right elbow   . Nodule of finger of left hand       Plan: Conjunctivitis -  Medications prescribed today:  Polytrim drops  Discussed diagnosis of conjunctivitis/pink eye.  Advised that pink eye is very contagious and spreads by direct contact.  Discussed treatment including moist warm compresses, antibiotic drops, avoid rubbing eyes, do not wear contact lenses or makeup until infection is resolved.  Discussed prevention, hand washing, not rubbing eyes.    Patient was advised to call or return if worse or not improving in the next few days.    Patient voiced understanding of diagnosis, recommendations, and  treatment plan.  Right hand tendonitis, lateral epicondylitis -    Begin Naprosyn twice daily for 5-10 days for pain and inflammation  You can continue the splint for now that you already have daily  You can even use arm sling 1-2 hours at a times on and off to REST the hand  This is an over use/repetitive use type issue.  Thus the main treatment is REST  I recommend alternating ice and heat therapy  We will refer to hand specialist about nodules  Nodules of fingers - refer to hand surgery for further eval and treatment    After visit summary given.   [name] was seen today for possible pink eye.  Diagnoses and all orders for this visit:  Conjunctivitis of both eyes, unspecified conjunctivitis type  Hand tendonitis -     Ambulatory referral to Hand Surgery  Lateral epicondylitis of right elbow  Nodule of finger of left hand -     Ambulatory referral to Hand Surgery  Other orders -     trimethoprim-polymyxin b (POLYTRIM) ophthalmic solution; Place 1 drop into both eyes every 6 (six) hours. -     naproxen (NAPROSYN) 500 MG tablet; Take 1 tablet (500 mg total) by mouth 2 (two) times daily with a meal.

## 2018-08-12 NOTE — Patient Instructions (Signed)
Tendonitis of hand, tendonitis of elbow   Begin Naprosyn twice daily for 5-10 days for pain and inflammation  You can continue the splint for now that you already have daily  You can even use arm sling 1-2 hours at a times on and off to REST the hand  This is an over use/repetitive use type issue.  Thus the main treatment is REST  I recommend alternating ice and heat therapy  We will refer to hand specialist about nodules   Pink Eye Specific recommendations today include:                                                          Pink eye is very contagious and spreads by direct contact.    You may be given antibiotic eyedrops as part of your treatment. Before using your eye medicine, remove all drainage from the eye by washing gently with warm water and cotton balls.   Continue to use the medication until you have awakened 2 mornings in a row without discharge from the eye.   Do not rub your eye. This increases the irritation and helps spread infection.   Use separate towels from other household members.   Wash your hands with soap and water before and after touching your eyes.   Use cold compresses to reduce pain and sunglasses to relieve irritation from light.  Do not wear contact lenses or wear eye makeup until the infection is gone.  Call or return if worse or not seeing improvement in the next few days.   Medication costs:  If you get to the pharmacy and medication prescribed today was either too expensive, not covered by your insurance, or required prior authorization, then please call us back to let us know.  We often have no way to know if a medication is too expensive or not covered by your insurance.  Thanks for your cooperation.     I have included other useful information below for your review.   Bacterial Conjunctivitis Bacterial conjunctivitis (commonly called pink eye) is redness, soreness, or puffiness (inflammation) of the white part of your eye. It is  caused by a germ called bacteria. These germs can easily spread from person to person (contagious). Your eye often will become red or pink. Your eye may also become irritated, watery, or have a thick discharge.  HOME CARE   Apply a cool, clean washcloth over closed eyelids. Do this for 10-20 minutes, 3-4 times a day while you have pain.  Gently wipe away any fluid coming from the eye with a warm, wet washcloth or cotton ball.  Wash your hands often with soap and water. Use paper towels to dry your hands.  Do not share towels or washcloths.  Change or wash your pillowcase every day.  Do not use eye makeup until the infection is gone.  Do not use machines or drive if your vision is blurry.  Stop using contact lenses. Do not use them again until your doctor says it is okay.  Do not touch the tip of the eye drop bottle or medicine tube with your fingers when you put medicine on the eye. GET HELP RIGHT AWAY IF:   Your eye is not better after 3 days of starting your medicine.  You have a yellowish fluid  coming out of the eye.  You have more pain in the eye.  Your eye redness is spreading.  Your vision becomes blurry.  You have a fever or lasting symptoms for more than 2-3 days.  You have a fever and your symptoms suddenly get worse.  You have pain in the face.  Your face gets red or puffy (swollen). MAKE SURE YOU:   Understand these instructions.  Will watch this condition.  Will get help right away if you are not doing well or get worse. Document Released: 05/02/2008 Document Revised: 07/10/2012 Document Reviewed: 03/29/2012 [facility] Patient Information 2015 [place], [place]. This information is not intended to replace advice given to you by your health care provider. Make sure you discuss any questions you have with your health care provider.    Viral Conjunctivitis Conjunctivitis is an irritation (inflammation) of the clear membrane that covers the white part of the  eye (the conjunctiva). The irritation can also happen on the underside of the eyelids. Conjunctivitis makes the eye red or pink in color. This is what is commonly known as pink eye. Viral conjunctivitis can spread easily (contagious). CAUSES   Infection from virus on the surface of the eye.  Infection from the irritation or injury of nearby tissues such as the eyelids or cornea.  More serious inflammation or infection on the inside of the eye.  Other eye diseases.  The use of certain eye medications. SYMPTOMS  The normally white color of the eye or the underside of the eyelid is usually pink or red in color. The pink eye is usually associated with irritation, tearing and some sensitivity to light. Viral conjunctivitis is often associated with a clear, watery discharge. If a discharge is present, there may also be some blurred vision in the affected eye. DIAGNOSIS  Conjunctivitis is diagnosed by an eye exam. The eye specialist looks for changes in the surface tissues of the eye which take on changes characteristic of the specific types of conjunctivitis. A sample of any discharge may be collected on a Q-Tip (sterile swap). The sample will be sent to a lab to see whether or not the inflammation is caused by bacterial or viral infection. TREATMENT  Viral conjunctivitis will not respond to medicines that kill germs (antibiotics). Treatment is aimed at stopping a bacterial infection on top of the viral infection. The goal of treatment is to relieve symptoms (such as itching) with antihistamine drops or other eye medications.  HOME CARE INSTRUCTIONS   To ease discomfort, apply a cool, clean wash cloth to your eye for 10 to 20 minutes, 3 to 4 times a day.  Gently wipe away any drainage from the eye with a warm, wet washcloth or a cotton ball.  Wash your hands often with soap and use paper towels to dry.  Do not share towels or washcloths. This may spread the infection.  Change or wash your  pillowcase every day.  You should not use eye make-up until the infection is gone.  Stop using contacts lenses. Ask your eye professional how to sterilize or replace them before using again. This depends on the type of contact lenses used.  Do not touch the edge of the eyelid with the eye drop bottle or ointment tube when applying medications to the affected eye. This will stop you from spreading the infection to the other eye or to others. SEEK IMMEDIATE MEDICAL CARE IF:   The infection has not improved within 3 days of beginning treatment.  A  watery discharge from the eye develops.  Pain in the eye increases.  The redness is spreading.  Vision becomes blurred.  An oral temperature above 102 F (38.9 C) develops, or as your caregiver suggests.  Facial pain, redness or swelling develops.  Any problems that may be related to the prescribed medicine develop. MAKE SURE YOU:   Understand these instructions.  Will watch your condition.  Will get help right away if you are not doing well or get worse. Document Released: 07/24/2005 Document Revised: 10/16/2011 Document Reviewed: 03/12/2008 [facility] Patient Information 2015 [place], [place]. This information is not intended to replace advice given to you by your health care provider. Make sure you discuss any questions you have with your health care provider.

## 2018-09-09 ENCOUNTER — Other Ambulatory Visit: Payer: Self-pay | Admitting: Medical

## 2018-09-12 ENCOUNTER — Telehealth: Payer: Self-pay | Admitting: Medical

## 2018-09-12 NOTE — Telephone Encounter (Signed)
Pt left message and needs refill on her Omperazole 20mg 

## 2018-09-12 NOTE — Telephone Encounter (Signed)
pls refill 

## 2018-09-13 NOTE — Telephone Encounter (Signed)
This was refilled 09/09/18

## 2018-10-11 ENCOUNTER — Telehealth: Payer: Self-pay | Admitting: Medical

## 2018-10-11 MED ORDER — OMEPRAZOLE 20 MG PO CPDR: 20.0000 mg | DELAYED_RELEASE_CAPSULE | Freq: Every day | ORAL | 0 refills | Status: DC

## 2018-10-11 NOTE — Telephone Encounter (Signed)
pls send refill for 3 months

## 2018-10-11 NOTE — Telephone Encounter (Signed)
done

## 2018-10-11 NOTE — Telephone Encounter (Signed)
Pt called and needs a refill on Omeprazole. She also switched to the [facility] on [address] and will need all meds sent there in the future.

## 2018-12-09 ENCOUNTER — Other Ambulatory Visit: Payer: Self-pay | Admitting: Medical

## 2018-12-09 NOTE — Telephone Encounter (Signed)
Due for physical now, please schedule.  Refill #30 if needed

## 2018-12-09 NOTE — Telephone Encounter (Signed)
Is this ok to refill?  

## 2018-12-10 NOTE — Telephone Encounter (Signed)
Left message on voicemail for patient to call back to schedule appointment for CPE

## 2018-12-13 ENCOUNTER — Ambulatory Visit: Payer: 59 | Admitting: Medical

## 2018-12-13 ENCOUNTER — Encounter: Payer: Self-pay | Admitting: Medical

## 2018-12-13 ENCOUNTER — Other Ambulatory Visit: Payer: Self-pay

## 2018-12-13 VITALS — BP 130/80 | HR 90 | Temp 98.4°F | Resp 16 | Ht 67.0 in | Wt 217.8 lb

## 2018-12-13 DIAGNOSIS — E669 Obesity, unspecified: Secondary | ICD-10-CM

## 2018-12-13 DIAGNOSIS — Z0000 Encounter for general adult medical examination without abnormal findings: Secondary | ICD-10-CM

## 2018-12-13 DIAGNOSIS — Z1159 Encounter for screening for other viral diseases: Secondary | ICD-10-CM

## 2018-12-13 DIAGNOSIS — Z7185 Encounter for immunization safety counseling: Secondary | ICD-10-CM

## 2018-12-13 DIAGNOSIS — I10 Essential (primary) hypertension: Secondary | ICD-10-CM

## 2018-12-13 DIAGNOSIS — Z6830 Body mass index (BMI) 30.0-30.9, adult: Secondary | ICD-10-CM

## 2018-12-13 DIAGNOSIS — F411 Generalized anxiety disorder: Secondary | ICD-10-CM

## 2018-12-13 DIAGNOSIS — M810 Age-related osteoporosis without current pathological fracture: Secondary | ICD-10-CM

## 2018-12-13 DIAGNOSIS — Z7189 Other specified counseling: Secondary | ICD-10-CM

## 2018-12-13 DIAGNOSIS — K219 Gastro-esophageal reflux disease without esophagitis: Secondary | ICD-10-CM

## 2018-12-13 DIAGNOSIS — Z800 Family history of malignant neoplasm of digestive organs: Secondary | ICD-10-CM

## 2018-12-13 DIAGNOSIS — E559 Vitamin D deficiency, unspecified: Secondary | ICD-10-CM

## 2018-12-13 DIAGNOSIS — M545 Low back pain, unspecified: Secondary | ICD-10-CM

## 2018-12-13 DIAGNOSIS — Z1239 Encounter for other screening for malignant neoplasm of breast: Secondary | ICD-10-CM

## 2018-12-13 LAB — POCT URINALYSIS DIP (PROADVANTAGE DEVICE)
Blood, UA: NEGATIVE
Glucose, UA: NEGATIVE mg/dL
Nitrite, UA: NEGATIVE
Protein Ur, POC: 30 mg/dL — AB
Specific Gravity, Urine: 1.025
Urobilinogen, Ur: POSITIVE
pH, UA: 5.5 (ref 5.0–8.0)

## 2018-12-13 MED ORDER — ASPIRIN 81 MG PO TABS: 81.0000 mg | ORAL_TABLET | Freq: Every day | ORAL | 3 refills | Status: AC

## 2018-12-13 MED ORDER — OMEPRAZOLE 20 MG PO CPDR: 20.0000 mg | DELAYED_RELEASE_CAPSULE | Freq: Every day | ORAL | 1 refills | Status: DC

## 2018-12-13 MED ORDER — AMLODIPINE BESYLATE 10 MG PO TABS: 10.0000 mg | ORAL_TABLET | Freq: Every day | ORAL | 3 refills | Status: DC

## 2018-12-13 MED ORDER — ATENOLOL 25 MG PO TABS
25.0000 mg | ORAL_TABLET | Freq: Every day | ORAL | 3 refills | Status: DC
Start: 2018-12-13 — End: 2022-07-28

## 2018-12-13 MED ORDER — ALPRAZOLAM 0.5 MG PO TABS: 0.5000 mg | ORAL_TABLET | Freq: Every evening | ORAL | 1 refills | Status: DC | PRN

## 2018-12-13 NOTE — Progress Notes (Signed)
Subjective:   HPI  [name] is a 58 y.o. female who presents for Chief Complaint  Patient presents with  . CPE    non fasting CPE     Patient Care Team: Belvie Iribe, Camelia Eng, PA-C as PCP - General (Family Medicine) Sees dentist Sees eye doctor Dr. Gaetano Net, gyn, [facility]  Concerns: Hypertension-compliant with amlodipine and atenolol  Vitamin D deficiency-compliant vitamin D 5000 daily  New diagnosis of osteoporosis this year to gynecology.  Taking raloxifene.  Trying to get weightbearing and aerobic exercise daily  Occasionally uses Xanax for panicky feeling.  Would like refill of this   occasionally gets some low back pain.  She plans to do more stretching.  Reviewed their medical, surgical, family, social, medication, and allergy history and updated chart as appropriate.  Past Medical History:  Diagnosis Date  . Anxiety    GAD  . Depression    in late 90s  . Encounter for routine gynecological examination    Dr. Gaetano Net  . Endometriosis    Dr. Gertie Fey  . Genital herpes    no recent outbreaks, no med  . GERD (gastroesophageal reflux disease)   . Rosanna Randy disease    patient denies   . H/O echocardiogram 06/14/06   LV EF 55-60%, left atrium midly dilated, othewrise normal  . History of constipation    prior with bread/grains, uses miralax prn with good results  . History of EKG 07/19/10   normal EKG  . Hypertension   . Migraine   . Obesity   . Osteoporosis 2019   eval and treatment through gyn, Raloxifene  . Routine gynecological examination    sees gynecology yearly  . Smoker   . Traumatic arthritis of ankle    right -  . Wears glasses     Past Surgical History:  Procedure Laterality Date  . ANKLE SURGERY  2010   right, ORIF, s/p trauma  . CESAREAN SECTION     x 1  . CHALAZION EXCISION    . COLONOSCOPY  11/16   2 polyps, mild diverticulosis, repeat in 10 years; Dr. Silvano Rusk  . HYSTEROSCOPY     endometriosis  . left breast  surgery     removed benign cyst  . WISDOM TOOTH EXTRACTION      Social History   Socioeconomic History  . Marital status: Married    Spouse name: Not on file  . Number of children: Not on file  . Years of education: Not on file  . Highest education level: Not on file  Occupational History  . Not on file  Social Needs  . Financial resource strain: Not on file  . Food insecurity:    Worry: Not on file    Inability: Not on file  . Transportation needs:    Medical: Not on file    Non-medical: Not on file  Tobacco Use  . Smoking status: Current Every Day Smoker    Packs/day: 0.75    Types: Cigarettes  . Smokeless tobacco: Never Used  Substance and Sexual Activity  . Alcohol use: Yes    Alcohol/week: 21.0 standard drinks    Types: 21 Shots of liquor per week  . Drug use: No  . Sexual activity: Yes    Birth control/protection: Post-menopausal  Lifestyle  . Physical activity:    Days per week: Not on file    Minutes per session: Not on file  . Stress: Not on file  Relationships  . Social connections:  Talks on phone: Not on file    Gets together: Not on file    Attends religious service: Not on file    Active member of club or organization: Not on file    Attends meetings of clubs or organizations: Not on file    Relationship status: Not on file  . Intimate partner violence:    Fear of current or ex partner: Not on file    Emotionally abused: Not on file    Physically abused: Not on file    Forced sexual activity: Not on file  Other Topics Concern  . Not on file  Social History Narrative   Married again 12/2017, prior separated.   Walks an hour daily for exercise.   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  As of 12/2018    Family History  Problem Relation Age of Onset  . Cancer Mother        skin  . Heart disease Mother   . Hypertension Mother   . Hypertension Father   . Cancer Father        mouth cancer  . Cancer Brother 15        colon  . Colon cancer Brother 88  . Cancer Paternal Aunt   . Diabetes Paternal Grandmother   . Stroke Paternal Grandfather   . Diabetes Paternal Grandfather   . Colon polyps Neg Hx   . Esophageal cancer Neg Hx   . Rectal cancer Neg Hx   . Stomach cancer Neg Hx   . Breast cancer Neg Hx      Current Outpatient Medications:  .  ALPRAZolam (XANAX) 0.5 MG tablet, Take 1 tablet (0.5 mg total) by mouth at bedtime as needed for anxiety., Disp: 30 tablet, Rfl: 1 .  amLODipine (NORVASC) 10 MG tablet, Take 1 tablet (10 mg total) by mouth daily., Disp: 90 tablet, Rfl: 3 .  aspirin 81 MG tablet, Take 1 tablet (81 mg total) by mouth daily., Disp: 90 tablet, Rfl: 3 .  atenolol (TENORMIN) 25 MG tablet, Take 1 tablet (25 mg total) by mouth daily., Disp: 90 tablet, Rfl: 3 .  Cholecalciferol ([facility] VIT D 5000 HIGH-POTENCY PO), Take 5,000 Units by mouth daily., Disp: , Rfl:  .  omeprazole (PRILOSEC) 20 MG capsule, Take 1 capsule (20 mg total) by mouth daily., Disp: 90 capsule, Rfl: 1 .  raloxifene (EVISTA) 60 MG tablet, Take 60 mg by mouth daily., Disp: , Rfl:   Allergies  Allergen Reactions  . Ace Inhibitors Cough  . Other     Eye dilation dye     Review of Systems Constitutional: -fever, -chills, -sweats, -unexpected weight change, -decreased appetite, -fatigue Allergy: -sneezing, -itching, -congestion Dermatology: -changing moles, --rash, -lumps ENT: -runny nose, -ear pain, -sore throat, -hoarseness, -sinus pain, -teeth pain, - ringing in ears, -hearing loss, -nosebleeds Cardiology: -chest pain, -palpitations, -swelling, -difficulty breathing when lying flat, -waking up short of breath Respiratory: -cough, -shortness of breath, -difficulty breathing with exercise or exertion, -wheezing, -coughing up blood Gastroenterology: -abdominal pain, -nausea, -vomiting, -diarrhea, -constipation, -blood in stool, -changes in bowel movement, -difficulty swallowing or eating Hematology: -bleeding, -bruising   Musculoskeletal: -joint aches, -muscle aches, -joint swelling, -back pain, -neck pain, -cramping, -changes in gait Ophthalmology: denies vision changes, eye redness, itching, discharge Urology: -burning with urination, -difficulty urinating, -blood in urine, -urinary frequency, -urgency, -incontinence Neurology: -headache, -weakness, -tingling, -numbness, -memory loss, -falls, -dizziness Psychology: -depressed mood, -agitation, -sleep problems Breast/gyn: -breast tenderness, -discharge, -lumps, -vaginal discharge,- irregular  periods, -heavy periods     Objective:  BP 130/80   Pulse 90   Temp 98.4 F (36.9 C) (Oral)   Resp 16   Ht 5\' 7"  (1.702 m)   Wt 217 lb 12.8 oz (98.8 kg)   LMP 06/07/2011   SpO2 98%   BMI 34.11 kg/m   General appearance: alert, no distress, WD/WN, Caucasian female Skin: tanned noticeably, but no other worrisome lesions HEENT: normocephalic, conjunctiva/corneas normal, sclerae anicteric, PERRLA, EOMi, nares patent, no discharge or erythema, pharynx normal Oral cavity: MMM, tongue normal, teeth normal Neck: supple, no lymphadenopathy, no thyromegaly, no masses, normal ROM, no bruits Chest: non tender, normal shape and expansion Heart: RRR, normal S1, S2, no murmurs Lungs: CTA bilaterally, no wheezes, rhonchi, or rales Abdomen: +bs, soft, non tender, non distended, no masses, no hepatomegaly, no splenomegaly, no bruits Back: non tender, normal ROM, no scoliosis Musculoskeletal: upper extremities non tender, no obvious deformity, normal ROM throughout, lower extremities non tender, no obvious deformity, normal ROM throughout Extremities: no edema, no cyanosis, no clubbing Pulses: 2+ symmetric, upper and lower extremities, normal cap refill Neurological: alert, oriented x 3, CN2-12 intact, strength normal upper extremities and lower extremities, sensation normal throughout, DTRs 2+ throughout, no cerebellar signs, gait normal Psychiatric: normal affect, behavior  normal, pleasant  Breast/gyn/rectal - deferred to gynecology   Assessment and Plan :   Encounter Diagnoses  Name Primary?  . Routine general medical examination at a health care facility Yes  . Encounter for hepatitis C screening test for low risk patient   . Essential hypertension   . GERD without esophagitis   . Anxiety state   . Family history colon cancer - brother at 73 dx   . Vaccine counseling   . Class 1 obesity with serious comorbidity and body mass index (BMI) of 30.0 to 30.9 in adult, unspecified obesity type   . Osteoporosis, unspecified osteoporosis type, unspecified pathological fracture presence   . Acute low back pain, unspecified back pain laterality, unspecified whether sciatica present   . Screening for breast cancer   . Vitamin D deficiency     Physical exam - discussed and counseled on healthy lifestyle, diet, exercise, preventative care, vaccinations, sick and well care, proper use of emergency dept and after hours care, and addressed their concerns.    Health screening: Advised they see their eye doctor yearly for routine vision care. Advised they see their dentist yearly for routine dental care including hygiene visits twice yearly. See your gynecologist yearly for routine gynecological care.  We will request records from gynecology for 2019 pap, bone density, office notes  Cancer screening Counseled on self breast exams, mammograms, cervical cancer screening  Colonoscopy:  Reviewed colonoscopy on file that is up to date.  Due repeat in 2021 per her report given family history of colon cancer  Mammogram from 07/2018 normal, reviewed  Vaccinations: Advised yearly influenza vaccine  Counseled on Shingles vaccine at age 44 years and older Patient will check insurance coverage for this and consider vaccination   Separate significant chronic issues discussed: Hypertension-continue same therapy  Vitamin D deficiency-continue vitamin D  supplement  Obesity-continue efforts to lose weight through healthy diet and exercise  Osteoporosis-we will request records from gynecology, new diagnosis of osteoporosis this past year  GERD-uses omeprazole occasionally not daily, avoid GERD triggers  Low back pain- discussed regular exercise and stretching routine most days per week  Go for labs at [facility] per insurance requirement  Terrianna was seen today  for cpe.  Diagnoses and all orders for this visit:  Routine general medical examination at a health care facility -     POCT Urinalysis DIP (Proadvantage Device) -     Comprehensive metabolic panel; Future -     CBC; Future -     Hepatitis C antibody; Future -     VITAMIN D 25 Hydroxy (Vit-D Deficiency, Fractures); Future -     MM DIGITAL SCREENING BILATERAL; Future  Encounter for hepatitis C screening test for low risk patient -     Hepatitis C antibody; Future  Essential hypertension  GERD without esophagitis  Anxiety state  Family history colon cancer - brother at 59 dx  Vaccine counseling  Class 1 obesity with serious comorbidity and body mass index (BMI) of 30.0 to 30.9 in adult, unspecified obesity type  Osteoporosis, unspecified osteoporosis type, unspecified pathological fracture presence  Acute low back pain, unspecified back pain laterality, unspecified whether sciatica present  Screening for breast cancer  Vitamin D deficiency  Other orders -     omeprazole (PRILOSEC) 20 MG capsule; Take 1 capsule (20 mg total) by mouth daily. -     atenolol (TENORMIN) 25 MG tablet; Take 1 tablet (25 mg total) by mouth daily. -     aspirin 81 MG tablet; Take 1 tablet (81 mg total) by mouth daily. -     amLODipine (NORVASC) 10 MG tablet; Take 1 tablet (10 mg total) by mouth daily. -     ALPRAZolam (XANAX) 0.5 MG tablet; Take 1 tablet (0.5 mg total) by mouth at bedtime as needed for anxiety.    Follow-up pending labs, yearly for physical

## 2018-12-23 ENCOUNTER — Telehealth: Payer: Self-pay | Admitting: Medical

## 2018-12-23 NOTE — Telephone Encounter (Signed)
Labs are all ok.   Potassium a tenth elevated but likely due to being on a beta blocker BP med.

## 2018-12-23 NOTE — Telephone Encounter (Signed)
Patient advised.

## 2018-12-25 ENCOUNTER — Encounter: Payer: Self-pay | Admitting: Medical

## 2019-01-14 ENCOUNTER — Other Ambulatory Visit: Payer: Self-pay | Admitting: Medical

## 2019-01-14 ENCOUNTER — Telehealth: Payer: Self-pay

## 2019-01-14 MED ORDER — HYDROCHLOROTHIAZIDE 12.5 MG PO TABS: 12.5000 mg | ORAL_TABLET | Freq: Every day | ORAL | 1 refills | Status: DC

## 2019-01-14 NOTE — Telephone Encounter (Signed)
Patient called and states that you were supposed to call her in something for swelling in her legs and I did not see anything about that.   Please call patient at [phone].

## 2019-01-14 NOTE — Telephone Encounter (Signed)
I sent in trial of low dose Hydrochlorothiazide.   Have her try this for 2 week and see if this makes a difference.    Other thinks to help with swelling includes wearing OTC compression hose or compression socks such as Smart Wool knee high socks, regular exercise, limiting salt, and taking breaks regularly if sitting or standing in one place prolonged.  We will need to check BMET lab for potassium in a few weeks if she continues this - assuming this helps.  Have her call report on how this is doing in 2 weeks.

## 2019-01-15 ENCOUNTER — Other Ambulatory Visit: Payer: Self-pay | Admitting: Medical

## 2019-01-15 MED ORDER — SPIRONOLACTONE 25 MG PO TABS: 25.0000 mg | ORAL_TABLET | Freq: Every day | ORAL | 1 refills | Status: DC

## 2019-01-15 NOTE — Telephone Encounter (Signed)
Patient states that the HCTZ makes her sick and you said that you would send in something else.

## 2019-01-15 NOTE — Telephone Encounter (Signed)
Begin trial of spironolactone daily for edema

## 2019-01-15 NOTE — Telephone Encounter (Signed)
Pt was notified.  

## 2019-02-07 ENCOUNTER — Other Ambulatory Visit: Payer: Self-pay | Admitting: Medical

## 2019-02-13 ENCOUNTER — Telehealth: Payer: Self-pay | Admitting: Medical

## 2019-02-13 NOTE — Telephone Encounter (Signed)
Requested records received from [facility]  

## 2019-02-18 ENCOUNTER — Encounter: Payer: Self-pay | Admitting: Medical

## 2019-05-24 ENCOUNTER — Other Ambulatory Visit: Payer: Self-pay | Admitting: Medical

## 2020-09-15 LAB — HM DEXA SCAN

## 2021-12-12 LAB — HM MAMMOGRAPHY

## 2021-12-14 ENCOUNTER — Other Ambulatory Visit: Payer: Self-pay | Admitting: Obstetrics and Gynecology

## 2021-12-14 DIAGNOSIS — R928 Other abnormal and inconclusive findings on diagnostic imaging of breast: Secondary | ICD-10-CM

## 2021-12-24 ENCOUNTER — Ambulatory Visit
Admission: RE | Admit: 2021-12-24 | Discharge: 2021-12-24 | Disposition: A | Discharge status: Home / Self Care | Payer: PRIVATE HEALTH INSURANCE | Source: Ambulatory Visit | Attending: Obstetrics and Gynecology | Admitting: Obstetrics and Gynecology

## 2021-12-24 ENCOUNTER — Ambulatory Visit: Payer: 59

## 2021-12-24 DIAGNOSIS — R928 Other abnormal and inconclusive findings on diagnostic imaging of breast: Secondary | ICD-10-CM

## 2022-07-28 ENCOUNTER — Encounter: Payer: Self-pay | Admitting: Physician Assistant

## 2022-07-28 ENCOUNTER — Ambulatory Visit (INDEPENDENT_AMBULATORY_CARE_PROVIDER_SITE_OTHER): Payer: PRIVATE HEALTH INSURANCE | Admitting: Medical

## 2022-07-28 VITALS — BP 166/90 | HR 85 | Temp 98.1°F | Resp 18 | Ht 67.0 in | Wt 229.0 lb

## 2022-07-28 DIAGNOSIS — F419 Anxiety disorder, unspecified: Secondary | ICD-10-CM | POA: Diagnosis not present

## 2022-07-28 DIAGNOSIS — F439 Reaction to severe stress, unspecified: Secondary | ICD-10-CM

## 2022-07-28 DIAGNOSIS — Z23 Encounter for immunization: Secondary | ICD-10-CM

## 2022-07-28 DIAGNOSIS — R1012 Left upper quadrant pain: Secondary | ICD-10-CM

## 2022-07-28 DIAGNOSIS — E278 Other specified disorders of adrenal gland: Secondary | ICD-10-CM | POA: Diagnosis not present

## 2022-07-28 DIAGNOSIS — I10 Essential (primary) hypertension: Secondary | ICD-10-CM

## 2022-07-28 MED ORDER — HYDRALAZINE HCL 25 MG PO TABS: 25.0000 mg | ORAL_TABLET | Freq: Three times a day (TID) | ORAL | 3 refills | Status: DC

## 2022-07-28 NOTE — Patient Instructions (Signed)
  1. Adrenal nodule ([place]) Placed the below order and expect it would need to be prior authorized.  You should get a call regarding prior authorization and scheduling within a week.  If no one calls you please let us know. - CT ABDOMEN W WO CONTRAST; Future  2. Left upper quadrant abdominal pain Placed referral to GI MD.  During the interim you could try famotidine/Pepcid over-the-counter when you have the pain to see if pain resolves.  If pain worsens or changes let us know.  Any severe pain recommend ED evaluation. - Ambulatory referral to Gastroenterology  3. Hypertension, unspecified type Continue valsartan 320 mg daily, nebivolol 20 mg twice daily and adding on hydralazine 25 mg 3 times daily.  Check blood pressure daily and follow-up in 2 weeks for follow-up with myself.  4. Anxiety Can continue Xanax sporadically/intermittently for increased anxiety.  If you want to use daily medication in future such as SSRI let us know.  5. Stress Commend conservative measures for stress related symptoms daily exercise.  Make sure you are getting adequate sleep.  Follow-up in 2 weeks or sooner if needed.

## 2022-07-28 NOTE — Addendum Note (Signed)
Addended by: Jeronimo Greaves on: 07/28/2022 09:19 AM   Modules accepted: Orders

## 2022-07-28 NOTE — Progress Notes (Signed)
Subjective:    Patient ID: [name], female    DOB: 03/04/1961, 61 y.o.   MRN: [id number]  HPI  Pt in for first time.   Pt works as Research scientist (physical sciences) for [employer], no exercise, describes healthy diet, cut caffeine out 2 weeks ago, smoker- almost pack 31 years. 1-2 mixed drink every night.  Htn- Pt is on valsartan 320 mg daily, nebivolol  20 mg twice daily. Pt states that her bp is always high. She states xanax will decrease her bp. She thinks stress is cause. Bp readings are high at home as well. Formerly amlodipine, lisinopri(cough), spirinolactaone,  hctz(felt bad)and atenolol. She states bp on fomrer meds did not help.   Gerd- on omeprazole 20 mg daily.  Pt does have gynecologsit Dr. Gertie Fey. Gyn rx'd raloxifene. Pap smear done this year per pt. States up to date on mammogram as well.   Pt has not had shingles vaccine. Will get today. Also due for tdap.  Pt had CT at former pcp.  CONCLUSION:  1. Heterogeneous enhancement of the bilateral kidneys, left greater than right, is favored secondary to timing of the contrast bolus. However, pyelonephritis could have a similar appearance in the correct clinical setting. 2. Otherwise, no acute findings in the abdomen or pelvis. 3. Intermediate density 3.3 cm left adrenal nodule. Recommend further evaluation with adrenal protocol CT.   Pt states had luq pain for more than a year. Former pcp could not figure out. Pt states pain on and off luq usually after eating or drinking. Pt states history of diverticulosis.  Pt has intermittent anxiety. She has xanax 0.5 mg and use on average 1 tab every 2 weeks.   Review of Systems  Constitutional:  Negative for chills, fatigue and fever.  HENT:  Negative for congestion and drooling.   Respiratory:  Negative for cough, chest tightness and shortness of breath.   Cardiovascular:  Negative for chest pain and palpitations.  Gastrointestinal:  Positive for abdominal pain. Negative for abdominal  distention, blood in stool, constipation and diarrhea.  Genitourinary:  Negative for dysuria.  Musculoskeletal:  Negative for back pain and joint swelling.  Neurological:  Negative for dizziness, seizures, syncope, facial asymmetry, weakness and headaches.  Hematological:  Negative for adenopathy. Does not bruise/bleed easily.  Psychiatric/Behavioral:  Negative for behavioral problems and confusion.    Past Medical History:  Diagnosis Date   Anxiety    GAD   Depression    in late 94s   Encounter for routine gynecological examination    Dr. Gaetano Net   Endometriosis    Dr. Gertie Fey   Genital herpes    no recent outbreaks, no med   GERD (gastroesophageal reflux disease)    Rosanna Randy disease    patient denies    H/O echocardiogram 06/14/06   LV EF 55-60%, left atrium midly dilated, othewrise normal   History of constipation    prior with bread/grains, uses miralax prn with good results   History of EKG 07/19/10   normal EKG   Hypertension    Migraine    Obesity    Osteoporosis 2019   eval and treatment through gyn, Raloxifene   Routine gynecological examination    sees gynecology yearly   Smoker    Traumatic arthritis of ankle    right -   Wears glasses      Social History   Socioeconomic History   Marital status: Married    Spouse name: Not on file   Number of children: Not on  file   Years of education: Not on file   Highest education level: Not on file  Occupational History   Not on file  Tobacco Use   Smoking status: Every Day    Packs/day: 0.75    Types: Cigarettes   Smokeless tobacco: Never  Substance and Sexual Activity   Alcohol use: Yes    Alcohol/week: 21.0 standard drinks of alcohol    Types: 21 Shots of liquor per week   Drug use: No   Sexual activity: Yes    Birth control/protection: Post-menopausal  Other Topics Concern   Not on file  Social History Narrative   Married again 12/2017, prior separated.   Walks an hour daily for exercise.   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 Silvano Rusk   HYSTEROSCOPY     endometriosis   left breast surgery     removed benign cyst   WISDOM TOOTH EXTRACTION      Family History  Problem Relation Age of Onset   Cancer Mother        skin   Heart disease Mother    Hypertension Mother    Hypertension Father    Cancer Father        mouth cancer   Cancer Brother 33       colon   Colon cancer Brother 32   Cancer Paternal Aunt    Diabetes Paternal Grandmother    Stroke Paternal Grandfather    Diabetes Paternal Grandfather    Colon polyps Neg Hx    Esophageal cancer Neg Hx    Rectal cancer Neg Hx    Stomach cancer Neg Hx    Breast cancer Neg Hx     Allergies  Allergen Reactions   Ace Inhibitors Cough   Amlodipine Swelling   Hydrochlorothiazide Other (See Comments)    Felt "bad"   Lisinopril Cough   Other     Eye dilation dye    Current Outpatient Medications on File Prior to Visit  Medication Sig Dispense Refill   aspirin 81 MG tablet Take 1 tablet (81 mg total) by mouth daily. 90 tablet 3   Cholecalciferol ([employer]S VIT D 5000 HIGH-POTENCY PO) Take 5,000 Units by mouth daily.     omeprazole (PRILOSEC) 20 MG capsule TAKE 1 CAPSULE BY MOUTH EVERY DAY 90 capsule 1   raloxifene (EVISTA) 60 MG tablet Take 60 mg by mouth daily.     UNABLE TO FIND Med Name: ultra flora     UNABLE TO FIND Take 55 mg by mouth daily. Med Name: ba;ance probiotic      Nebivolol HCl 20 MG TABS Take 2 tablets by mouth daily.     valsartan (DIOVAN) 320 MG tablet Take 320 mg by mouth daily.     No current facility-administered medications on file prior to visit.    BP (!) 166/90  Pulse 85   Temp 98.1 F (36.7 C)   Resp 18   Ht '5\' 7"'$  (1.702 m)   Wt 229 lb (103.9 kg)   LMP 06/07/2011   SpO2 100%   BMI 35.87 kg/m        Objective:   Physical Exam  General Mental Status- Alert. General Appearance- Not in acute distress.   Skin General: Color- Normal Color. Moisture- Normal Moisture.  Neck Carotid Arteries- Normal color. Moisture- Normal Moisture. No carotid bruits. No JVD.  Chest and Lung Exam Auscultation: Breath Sounds:-Normal.  Cardiovascular Auscultation:Rythm- Regular. Murmurs & Other Heart Sounds:Auscultation of the heart reveals- No Murmurs.  Abdomen Inspection:-Inspeection Normal. Palpation/Percussion:Note:No mass. Palpation and Percussion of the abdomen reveal- Non Tender, Non Distended + BS, no rebound or guarding.    Neurologic Cranial Nerve exam:- CN III-XII intact(No nystagmus), symmetric smile. Drift Test:- No drift. Romberg Exam:- Negative.  Heal to Toe Gait exam:-Normal. Finger to Nose:- Normal/Intact Strength:- 5/5 equal and symmetric strength both upper and lower extremities.      Assessment & Plan:   Patient Instructions   1. Adrenal nodule ([place]) Placed the below order and expect it would need to be prior authorized.  You should get a call regarding prior authorization and scheduling within a week.  If no one calls you please let us know. - CT ABDOMEN W WO CONTRAST; Future  2. Left upper quadrant abdominal pain Placed referral to GI MD.  During the interim you could try famotidine/Pepcid over-the-counter when you have the pain to see if pain resolves.  If pain worsens or changes let us know.  Any severe pain recommend ED evaluation. - Ambulatory referral to Gastroenterology  3. Hypertension,  unspecified type Continue valsartan 320 mg daily, nebivolol 20 mg twice daily and adding on hydralazine 25 mg 3 times daily.  Check blood pressure daily and follow-up in 2 weeks for follow-up with myself.  4. Anxiety Can continue Xanax sporadically/intermittently for increased anxiety.  If you want to use daily medication in future such as SSRI let us know.  5. Stress Commend conservative measures for stress related symptoms daily exercise.  Make sure you are getting adequate sleep.  Follow-up in 2 weeks or sooner if needed.   Time spent with patient today was 50  minutes which consisted of chart revdiew, discussing diagnosis, work up treatment and documentation.   Mackie Pai, PA-C

## 2022-08-03 ENCOUNTER — Telehealth: Payer: Self-pay | Admitting: Medical

## 2022-08-03 NOTE — Telephone Encounter (Signed)
No openings today or tomorrow within our office

## 2022-08-03 NOTE — Telephone Encounter (Signed)
[facility] aggred to open a spot for her tomorrow 08/03/2022. Pt will have an OV at 10:00am.

## 2022-08-03 NOTE — Telephone Encounter (Signed)
Patient does have an appt on 08/11/22

## 2022-08-03 NOTE — Telephone Encounter (Signed)
Patient states she said shingle shot last week at visit & her arm is still pretty swollen/hurting  She would like to ask and talk to the nurse about this just incase she is having a reaction  Please advise

## 2022-08-04 ENCOUNTER — Encounter: Payer: Self-pay | Admitting: Medical

## 2022-08-04 ENCOUNTER — Ambulatory Visit (INDEPENDENT_AMBULATORY_CARE_PROVIDER_SITE_OTHER): Payer: PRIVATE HEALTH INSURANCE | Admitting: Medical

## 2022-08-04 VITALS — BP 140/85 | HR 87 | Temp 98.0°F | Resp 18 | Ht 67.0 in | Wt 229.0 lb

## 2022-08-04 DIAGNOSIS — I10 Essential (primary) hypertension: Secondary | ICD-10-CM

## 2022-08-04 DIAGNOSIS — R21 Rash and other nonspecific skin eruption: Secondary | ICD-10-CM

## 2022-08-04 MED ORDER — CEPHALEXIN 500 MG PO CAPS: 500.0000 mg | ORAL_CAPSULE | Freq: Two times a day (BID) | ORAL | 0 refills | Status: DC

## 2022-08-04 NOTE — Progress Notes (Signed)
Subjective:    Patient ID: [name], female    DOB: [birth year], 61 y.o.   MRN: [id number]  HPI Pt in for left arm soreness after shingles vaccine one week ago. She states next day started to get and swollen. Since then some improvement but still faint red and warm. No fever, no chills or sweats. Got message yesterday and got her in today.  Htn- pt bp borderline. Better than last visit.  For adrenal nodule I placed referral for CT abd. Pt has not been called yet.  Review of Systems  Constitutional:  Negative for chills, fatigue and fever.  Respiratory:  Negative for cough, chest tightness, shortness of breath and wheezing.   Cardiovascular:  Negative for chest pain and palpitations.  Gastrointestinal:  Negative for abdominal pain and blood in stool.  Musculoskeletal:  Negative for back pain and myalgias.  Hematological:  Negative for adenopathy. Does not bruise/bleed easily.   Past Medical History:  Diagnosis Date   Anxiety    GAD   Depression    in late 59s   Encounter for routine gynecological examination    Dr. Gaetano Net   Endometriosis    Dr. Gertie Fey   Genital herpes    no recent outbreaks, no med   GERD (gastroesophageal reflux disease)    Rosanna Randy disease    patient denies    H/O echocardiogram 06/14/06   LV EF 55-60%, left atrium midly dilated, othewrise normal   History of constipation    prior with bread/grains, uses miralax prn with good results   History of EKG 07/19/10   normal EKG   Hypertension    Migraine    Obesity    Osteoporosis 2019   eval and treatment through gyn, Raloxifene   Routine gynecological examination    sees gynecology yearly   Smoker    Traumatic arthritis of ankle    right -   Wears glasses      Social History   Socioeconomic History   Marital status: Married    Spouse name: Not on file   Number of children: Not on file   Years of education: Not on file   Highest education level: Not on file  Occupational History   Not  on file  Tobacco Use   Smoking status: Every Day    Packs/day: 0.75    Types: Cigarettes   Smokeless tobacco: Never  Substance and Sexual Activity   Alcohol use: Yes    Alcohol/week: 21.0 standard drinks of alcohol    Types: 21 Shots of liquor per week   Drug use: No   Sexual activity: Yes    Birth control/protection: Post-menopausal  Other Topics Concern   Not on file  Social History Narrative   Married again 12/2017, prior separated.   Walks an hour daily for exercise.   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 Silvano Rusk   HYSTEROSCOPY     endometriosis   left breast surgery     removed benign cyst   WISDOM TOOTH EXTRACTION      Family History  Problem Relation Age of Onset   Cancer Mother        skin   Heart disease Mother    Hypertension Mother    Hypertension Father    Cancer Father        mouth cancer   Cancer Brother 44       colon   Colon cancer Brother 6   Cancer Paternal Aunt    Diabetes Paternal Grandmother    Stroke Paternal Grandfather    Diabetes Paternal Grandfather    Colon polyps Neg Hx    Esophageal cancer Neg Hx    Rectal cancer Neg Hx    Stomach cancer Neg Hx    Breast cancer Neg Hx     Allergies  Allergen Reactions   Ace Inhibitors Cough   Amlodipine Swelling   Hydrochlorothiazide Other (See Comments)    Felt "bad"   Lisinopril Cough   Other     Eye dilation dye    Current Outpatient Medications  on File Prior to Visit  Medication Sig Dispense Refill   ALPRAZolam (XANAX) 0.5 MG tablet Take by mouth.     aspirin 81 MG tablet Take 1 tablet (81 mg total) by mouth daily. 90 tablet 3   Cholecalciferol (CVS VIT D 5000 HIGH-POTENCY PO) Take 5,000 Units by mouth daily.     hydrALAZINE (APRESOLINE) 25 MG tablet Take 1 tablet (25 mg total) by mouth 3 (three) times daily. 90 tablet 3   Nebivolol HCl 20 MG TABS Take 2 tablets by mouth daily.     omeprazole (PRILOSEC) 20 MG capsule TAKE 1 CAPSULE BY MOUTH EVERY DAY 90 capsule 1   raloxifene (EVISTA) 60 MG tablet Take 60 mg by mouth daily.     UNABLE TO FIND Med Name: ultra flora     UNABLE TO FIND Take 55 mg by mouth daily. Med Name: ba;ance probiotic     valsartan (DIOVAN) 320 MG tablet Take 320 mg by mouth daily.     No current facility-administered medications on file prior to visit.    BP (!) 140/85   Pulse 87   Temp 98 F (36.7 C)   Resp 18   Ht '5\' 7"'$  (1.702 m)   Wt 229 lb (103.9 kg)   LMP 06/07/2011   SpO2 100%   BMI 35.87 kg/m          Objective:   Physical Exam  General- No acute distress. Pleasant patient. Neck- Full range of motion, no jvd Lungs- Clear, even and unlabored. Heart- regular rate and rhythm. Neurologic- CNII- XII grossly intact.  Left arm- very faint barely visible pinkness to small area. Not obvious warmth.      Assessment & Plan:   Patient Instructions  Localized reaction to shingles vaccine vs skin infection. It would be reasonable for you not to get shingrix vaccine based on described reaction. I am making keflex available if area worsens as discussed.   For htn some improved continue current regimen.  Will send message to referral coordinator and ask her to check on status prior authorization for your ct abd for adrenal nodule  Follow up as regularly scheduled or sooner if needed.    Mackie Pai, PA-C

## 2022-08-04 NOTE — Addendum Note (Signed)
Addended by: Anabel Halon on: 08/04/2022 10:53 AM   Modules accepted: Level of Service

## 2022-08-04 NOTE — Patient Instructions (Addendum)
Localized reaction to shingles vaccine vs skin infection. It would be reasonable for you not to get shingrix vaccine based on described reaction. I am making keflex available if area worsens as discussed.   For htn some improved continue current regimen. Some improved with addition of hydralazine.  Will send message to referral coordinator and ask her to check on status prior authorization for your ct abd for adrenal nodule. Coordinator did advise was authorized. So please get me number of location that your insurance prefers and can send over the order.  Follow up as regularly scheduled or sooner if needed.

## 2022-08-11 ENCOUNTER — Encounter: Payer: Self-pay | Admitting: Medical

## 2022-08-11 ENCOUNTER — Ambulatory Visit (INDEPENDENT_AMBULATORY_CARE_PROVIDER_SITE_OTHER): Payer: PRIVATE HEALTH INSURANCE | Admitting: Medical

## 2022-08-11 VITALS — BP 140/89 | HR 86 | Resp 18 | Ht 67.0 in | Wt 233.4 lb

## 2022-08-11 DIAGNOSIS — I10 Essential (primary) hypertension: Secondary | ICD-10-CM

## 2022-08-11 DIAGNOSIS — F419 Anxiety disorder, unspecified: Secondary | ICD-10-CM

## 2022-08-11 DIAGNOSIS — E278 Other specified disorders of adrenal gland: Secondary | ICD-10-CM | POA: Diagnosis not present

## 2022-08-11 MED ORDER — NEBIVOLOL HCL 20 MG PO TABS: 2.0000 | ORAL_TABLET | Freq: Every day | ORAL | 3 refills | Status: DC

## 2022-08-11 MED ORDER — VALSARTAN 320 MG PO TABS: 320.0000 mg | ORAL_TABLET | Freq: Every day | ORAL | 3 refills | Status: DC

## 2022-08-11 NOTE — Progress Notes (Signed)
Subjective:    Patient ID: [name], female    DOB: [birth year], 62 y.o.   MRN: [id number]  HPI  Pt in for follow up.  Pt states her left arm feels back to normal. No rash or  itching. Pt never took the keflex I prescribed. Area got mild red after shingrix vaccine.  Bp mild high initially- pt is on valsartan, nebivolol and valsartan.  History of adrenal nodule and needs repeat CT. She needs print order to get done at preferred location.   Pt has sporadic occasional panic attack. Uses xanax very sparingly.   Review of Systems  Constitutional:  Negative for chills, fatigue and fever.  HENT:  Negative for congestion, ear discharge, ear pain and facial swelling.   Respiratory:  Negative for cough, chest tightness, shortness of breath and wheezing.   Cardiovascular:  Negative for chest pain and palpitations.  Gastrointestinal:  Negative for abdominal pain, constipation and nausea.  Musculoskeletal:  Negative for back pain, joint swelling and neck pain.  Skin:  Negative for rash.  Neurological:  Negative for dizziness, speech difficulty, numbness and headaches.  Hematological:  Negative for adenopathy. Does not bruise/bleed easily.  Psychiatric/Behavioral:  Negative for behavioral problems, confusion and dysphoric mood.     Past Medical History:  Diagnosis Date   Anxiety    GAD   Depression    in late 68s   Encounter for routine gynecological examination    Dr. Gaetano Net   Endometriosis    Dr. Gertie Fey   Genital herpes    no recent outbreaks, no med   GERD (gastroesophageal reflux disease)    Rosanna Randy disease    patient denies    H/O echocardiogram 06/14/06   LV EF 55-60%, left atrium midly dilated, othewrise normal   History of constipation    prior with bread/grains, uses miralax prn with good results   History of EKG 07/19/10   normal EKG   Hypertension    Migraine    Obesity    Osteoporosis 2019   eval and treatment through gyn, Raloxifene   Routine  gynecological examination    sees gynecology yearly   Smoker    Traumatic arthritis of ankle    right -   Wears glasses      Social History   Socioeconomic History   Marital status: Married    Spouse name: Not on file   Number of children: Not on file   Years of education: Not on file   Highest education level: Not on file  Occupational History   Not on file  Tobacco Use   Smoking status: Every Day    Packs/day: 0.75    Types: Cigarettes   Smokeless tobacco: Never  Substance and Sexual Activity   Alcohol use: Yes    Alcohol/week: 21.0 standard drinks of alcohol    Types: 21 Shots of liquor per week   Drug use: No   Sexual activity: Yes    Birth control/protection: Post-menopausal  Other Topics Concern   Not on file  Social History Narrative   Married again 12/2017, prior separated.   Walks an hour daily for exercise.   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 Silvano Rusk   HYSTEROSCOPY     endometriosis   left breast surgery     removed benign cyst   WISDOM TOOTH EXTRACTION      Family History  Problem Relation Age of Onset   Cancer Mother        skin   Heart disease Mother    Hypertension Mother    Hypertension Father    Cancer Father        mouth cancer   Cancer Brother 26       colon   Colon cancer Brother 67   Cancer Paternal Aunt    Diabetes Paternal Grandmother    Stroke Paternal Grandfather    Diabetes Paternal  Grandfather    Colon polyps Neg Hx    Esophageal cancer Neg Hx    Rectal cancer Neg Hx    Stomach cancer Neg Hx    Breast cancer Neg Hx     Allergies  Allergen Reactions   Ace Inhibitors Cough   Amlodipine Swelling   Hydrochlorothiazide Other (See Comments)    Felt "bad"   Lisinopril Cough   Other     Eye dilation dye    Current Outpatient Medications on File Prior to Visit  Medication Sig Dispense Refill   ALPRAZolam (XANAX) 0.5 MG tablet Take by mouth.     aspirin 81 MG tablet Take 1 tablet (81 mg total) by mouth daily. 90 tablet 3   cephALEXin (KEFLEX) 500 MG capsule Take 1 capsule (500 mg total) by mouth 2 (two) times daily. 14 capsule 0   Cholecalciferol (CVS VIT D 5000 HIGH-POTENCY PO) Take 5,000 Units by mouth daily.     hydrALAZINE (APRESOLINE) 25 MG tablet Take 1 tablet (25 mg total) by mouth 3 (three) times daily. 90 tablet 3   omeprazole (PRILOSEC) 20 MG capsule TAKE 1 CAPSULE BY MOUTH EVERY DAY 90 capsule 1   raloxifene (EVISTA) 60 MG tablet Take 60 mg by mouth daily.     UNABLE TO FIND Med Name: ultra flora     UNABLE TO FIND Take 55 mg by mouth daily. Med Name: ba;ance probiotic     No current facility-administered medications on file prior to visit.    BP (!) 140/89   Pulse 86   Resp 18   Ht '5\' 7"'$  (1.702 m)   Wt 233 lb 6.4 oz (105.9 kg)   LMP 06/07/2011   SpO2 97%   BMI 36.56 kg/m            Objective:   Physical Exam  General Mental Status- Alert. General Appearance- Not in acute distress.   Skin General: Color- Normal Color. Moisture- Normal Moisture.  Neck Carotid Arteries- Normal color. Moisture- Normal Moisture. No carotid bruits. No JVD.  Chest and Lung Exam Auscultation: Breath Sounds:-Normal.  Cardiovascular Auscultation:Rythm- Regular. Murmurs & Other Heart Sounds:Auscultation of the heart reveals- No Murmurs.  Abdomen Inspection:-Inspeection Normal. Palpation/Percussion:Note:No mass. Palpation and Percussion of the abdomen  reveal- Non Tender, Non Distended + BS, no rebound or guarding.  Neurologic Cranial Nerve exam:- CN III-XII intact(No nystagmus), symmetric smile. Strength:- 5/5 equal and symmetric strength both upper and lower extremities.  Abdomen- soft, nt, nd, +bs, no rebound or guarding.    Assessment & Plan:   Patient Instructions  Resolved rash left upper ext. Possible reaction to shingrix vaccine as we discussed.  Htn- bp better but still borderline. Refilled bp meds today. Going forward if bp increases would increase your hydralazine to 50 mg three times daily.  For anxiety/panic attack continue rare use of xanax. Let me know when you need refill.   Follow thru with CT to evaluate your probable adrenal nodule. If I don't send you note on result let me know as I don't know where study is being done and it may come as a fax.   Follow up in 3 months or as needed   [employer], [employer]

## 2022-08-11 NOTE — Patient Instructions (Addendum)
Resolved rash left upper ext. Possible reaction to shingrix vaccine as we discussed.  Htn- bp better but still borderline. Refilled bp meds today. Going forward if bp increases would increase your hydralazine to 50 mg three times daily.  For anxiety/panic attack continue rare use of xanax. Let me know when you need refill.   Follow thru with CT to evaluate your probable adrenal nodule. If I don't send you note on result let me know as I don't know where study is being done and it may come as a fax.   Follow up in 3 months or as needed

## 2022-08-22 NOTE — Addendum Note (Signed)
Addended by: Anabel Halon on: 08/22/2022 05:44 PM   Modules accepted: Orders

## 2022-08-24 ENCOUNTER — Ambulatory Visit (INDEPENDENT_AMBULATORY_CARE_PROVIDER_SITE_OTHER): Payer: PRIVATE HEALTH INSURANCE | Admitting: Physician Assistant

## 2022-08-24 ENCOUNTER — Encounter: Payer: Self-pay | Admitting: Physician Assistant

## 2022-08-24 VITALS — BP 130/90 | HR 87 | Ht 67.0 in | Wt 225.2 lb

## 2022-08-24 DIAGNOSIS — R140 Abdominal distension (gaseous): Secondary | ICD-10-CM | POA: Diagnosis not present

## 2022-08-24 DIAGNOSIS — K219 Gastro-esophageal reflux disease without esophagitis: Secondary | ICD-10-CM | POA: Diagnosis not present

## 2022-08-24 DIAGNOSIS — R1013 Epigastric pain: Secondary | ICD-10-CM | POA: Diagnosis not present

## 2022-08-24 MED ORDER — OMEPRAZOLE 20 MG PO CPDR: 20.0000 mg | DELAYED_RELEASE_CAPSULE | Freq: Two times a day (BID) | ORAL | 2 refills | Status: DC

## 2022-08-24 MED ORDER — AMBULATORY NON FORMULARY MEDICATION: 0 refills | Status: DC

## 2022-08-24 NOTE — Patient Instructions (Signed)
You have been scheduled for an endoscopy. Please follow written instructions given to you at your visit today. If you use inhalers (even only as needed), please bring them with you on the day of your procedure.  Increase your Omeprazole 20 mg to twice daily.   We have sent the following medications to your pharmacy for you to pick up at your convenience: Omeprazole , GI cocktail   Due to recent changes in healthcare laws, you may see the results of your imaging and laboratory studies on MyChart before your provider has had a chance to review them.  We understand that in some cases there may be results that are confusing or concerning to you. Not all laboratory results come back in the same time frame and the provider may be waiting for multiple results in order to interpret others.  Please give [place] 48 hours in order for your provider to thoroughly review all the results before contacting the office for clarification of your results.   _______________________________________________________  If your blood pressure at your visit was 140/90 or greater, please contact your primary care physician to follow up on this.  _______________________________________________________  If you are age 60 or older, your body mass index should be between 23-30. Your Body mass index is 35.27 kg/m. If this is out of the aforementioned range listed, please consider follow up with your Primary Care Provider.  If you are age 60 or younger, your body mass index should be between 19-25. Your Body mass index is 35.27 kg/m. If this is out of the aformentioned range listed, please consider follow up with your Primary Care Provider.   ________________________________________________________  The [facility] providers would like to encourage you to use [facility] to communicate with providers for non-urgent requests or questions.  Due to long hold times on the telephone, sending your provider a message by [facility] may be a  faster and more efficient way to get a response.  Please allow 48 business hours for a response.  Please remember that this is for non-urgent requests.  _______________________________________________________  Thank you for choosing me and [facility].  Ellouise Newer PA-C

## 2022-08-24 NOTE — Progress Notes (Signed)
Chief Complaint: LUQ/Epigastric Pain  HPI:    [name] is a  62 y/o Caucasian female with a past medical history of anxiety, depression, GERD, Sharma Covert and multiple others listed below, known to Dr. Carlean Purl, who was referred to me by Mackie Pai, PA-C for a complaint of left upper quadrant/ epigastric pain.      06/18/2015 colonoscopy for a family history of adenomatous polyp with 2 sessile polyps ranging from 2-4 mm in the sigmoid and transverse colon and mild diverticulosis.  Pathology showed hyperplastic polyps and repeat recommended in 10 years.    05/02/2022 abdominal x-ray with cardiomegaly no evidence of bowel obstruction or free air.    06/07/2022 echo with LVEF 55-60% and otherwise normal.    06/09/2022 CTAP with contrast with heterogenous enhancement in the bilateral kidneys, left greater than right, favored secondary to timing of the contrast bolus, however pyelonephritis could have a similar appearance, otherwise no acute findings, intermediate density 3.3 cm left adrenal nodule.    08/22/2022 CT abdomen with and without contrast redemonstrated left adrenal nodule with noncontrast attenuation most suggestive of adenoma, evidence of internal hemorrhoids within the nodule.    Today, the patient tells me that over the past 4 to 5 months she has been experiencing a left upper quadrant swelling/pain and sometimes generalized bloating.  The pain is described as crampy and a pressure and sometimes sharp rated as a 7-8/10 which can last for 30 minutes to 4 hours immediately after eating.  Describes that she has pretty normal bowel movements 4 times a day, sometimes they are little more greasy than normal and sometimes mucousy and sometimes diarrhea, but she seems to radiate back-and-forth and has done this for a while.  Describes CTs as above and thinking it may be her heart with echo as above.  She does have chronic reflux symptoms and takes Omeprazole 20 mg every morning which does stop them,  but has never had an endoscopy.    Denies fever, chills, weight loss, blood in her stool, nausea or vomiting.     Past Medical History:  Diagnosis Date   Anxiety    GAD   Depression    in late 90s   Encounter for routine gynecological examination    Dr. Gaetano Net   Endometriosis    Dr. Gertie Fey   Genital herpes    no recent outbreaks, no med   GERD (gastroesophageal reflux disease)    Rosanna Randy disease    patient denies    H/O echocardiogram 06/14/06   LV EF 55-60%, left atrium midly dilated, othewrise normal   History of constipation    prior with bread/grains, uses miralax prn with good results   History of EKG 07/19/10   normal EKG   Hypertension    Migraine    Obesity    Osteoporosis 2019   eval and treatment through gyn, Raloxifene   Routine gynecological examination    sees gynecology yearly   Smoker    Traumatic arthritis of ankle    right -   Wears glasses     Past Surgical History:  Procedure Laterality Date   ANKLE SURGERY  2010   right, ORIF, s/p trauma   CESAREAN SECTION     x 1   CHALAZION EXCISION     COLONOSCOPY  11/16   2 polyps, mild diverticulosis, repeat in 10 years; Dr. Silvano Rusk   HYSTEROSCOPY     endometriosis   left breast surgery     removed benign  cyst   WISDOM TOOTH EXTRACTION      Current Outpatient Medications  Medication Sig Dispense Refill   ALPRAZolam (XANAX) 0.5 MG tablet Take by mouth.     aspirin 81 MG tablet Take 1 tablet (81 mg total) by mouth daily. 90 tablet 3   cephALEXin (KEFLEX) 500 MG capsule Take 1 capsule (500 mg total) by mouth 2 (two) times daily. 14 capsule 0   Cholecalciferol ([employer] VIT D 5000 HIGH-POTENCY PO) Take 5,000 Units by mouth daily.     hydrALAZINE (APRESOLINE) 25 MG tablet Take 1 tablet (25 mg total) by mouth 3 (three) times daily. 90 tablet 3   Nebivolol HCl 20 MG TABS Take 2 tablets (40 mg total) by mouth daily. 180 tablet 3   omeprazole (PRILOSEC) 20 MG capsule TAKE 1 CAPSULE BY MOUTH EVERY DAY 90  capsule 1   raloxifene (EVISTA) 60 MG tablet Take 60 mg by mouth daily.     UNABLE TO FIND Med Name: ultra flora     UNABLE TO FIND Take 55 mg by mouth daily. Med Name: ba;ance probiotic     valsartan (DIOVAN) 320 MG tablet Take 1 tablet (320 mg total) by mouth daily. 90 tablet 3   No current facility-administered medications for this visit.    Allergies as of 08/24/2022 - Review Complete 08/24/2022  Allergen Reaction Noted   Ace inhibitors Cough 05/30/2011   Amlodipine Swelling 12/14/2020   Hydrochlorothiazide Other (See Comments) 01/22/2020   Lisinopril Cough 08/21/2018   Other  11/19/2014    Family History  Problem Relation Age of Onset   Cancer Mother        skin   Heart disease Mother    Hypertension Mother    Hypertension Father    Cancer Father        mouth cancer   Cancer Brother 6       colon   Colon cancer Brother 27   Cancer Paternal Aunt    Diabetes Paternal Grandmother    Stroke Paternal Grandfather    Diabetes Paternal Grandfather    Colon polyps Neg Hx    Esophageal cancer Neg Hx    Rectal cancer Neg Hx    Stomach cancer Neg Hx    Breast cancer Neg Hx     Social History   Socioeconomic History   Marital status: Married    Spouse name: Not on file   Number of children: Not on file   Years of education: Not on file   Highest education level: Not on file  Occupational History   Not on file  Tobacco Use   Smoking status: Every Day    Packs/day: 0.75    Types: Cigarettes   Smokeless tobacco: Never  Substance and Sexual Activity   Alcohol use: Yes    Alcohol/week: 21.0 standard drinks of alcohol    Types: 21 Shots of liquor per week   Drug use: No   Sexual activity: Yes    Birth control/protection: Post-menopausal  Other Topics Concern   Not on file  Social History Narrative   Married again 12/2017, prior separated.   Walks an hour daily for exercise.   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  As of 12/2018   Social  Determinants of Health   Financial Resource Strain: Not on file  Food Insecurity: Not on file  Transportation Needs: Not on file  Physical Activity: Not on file  Stress: Not on file  Social Connections: Not on file  Intimate Partner Violence: Not on file    Review of Systems:    Constitutional: No weight loss, fever or chills Skin: No rash g Cardiovascular: No chest pain  Respiratory: No SOB or cough Gastrointestinal: See HPI and otherwise negative Genitourinary: No dysuria or change in urinary frequency Neurological: No headache, dizziness or syncope Musculoskeletal: No new muscle or joint pain Hematologic: No bleeding Psychiatric: No history of depression or anxiety   Physical Exam:  Vital signs: BP (!) 130/90   Pulse 87   Ht '5\' 7"'$  (1.702 m)   Wt 225 lb 3 oz (102.1 kg)   LMP 06/07/2011   BMI 35.27 kg/m   Constitutional:   Pleasant obese Caucasian female appears to be in NAD, Well developed, Well nourished, alert and cooperative Head:  Normocephalic and atraumatic. Eyes:   PEERL, EOMI. No icterus. Conjunctiva pink. Ears:  Normal auditory acuity. Neck:  Supple Throat: Oral cavity and pharynx without inflammation, swelling or lesion.  Respiratory: Respirations even and unlabored. Lungs clear to auscultation bilaterally.   No wheezes, crackles, or rhonchi.  Cardiovascular: Normal S1, S2. No MRG. Regular rate and rhythm. No peripheral edema, cyanosis or pallor.  Gastrointestinal:  Soft, nondistended, mild to moderate epigastric/left upper quadrant TTP. No rebound or guarding. Normal bowel sounds. No appreciable masses or hepatomegaly. Rectal:  Not performed.  Msk:  Symmetrical without gross deformities. Without edema, no deformity or joint abnormality.  Neurologic:  Alert and  oriented x4;  grossly normal neurologically.  Skin:   Dry and intact without significant lesions or rashes. Psychiatric:  Demonstrates good judgement and reason without abnormal affect or  behaviors.  See HPI for recent imaging.  Assessment: 1.  Epigastric/left upper quadrant pain: For the past 4 to 5 months, CTs with a left adrenal nodule and otherwise normal, also with bloating; consider gastritis versus PUD versus gastroparesis versus SIBO versus functional symptoms 2.  GERD: Chronic for the patient, mostly controlled on Omeprazole 20 mg daily over the past many years  Plan: 1.  Recommend patient have an EGD for further evaluation of pain and chronic reflux.  This is scheduled with Dr. Carlean Purl in the [facility].  Did provide the patient a detailed list of risk for the procedure and she agrees to proceed. Patient is appropriate for endoscopic procedure(s) in the ambulatory ([facility]) setting.  2.  Pending above could consider gastric emptying study given feeling of bloating and pain that can last up to 4 hours after eating and feels like things are just "sitting in my stomach". 3.  Will trial increase of Omeprazole to 20 mg twice a day, 30-60 minutes before breakfast and dinner.  #60 with 5 refills. 4.  Prescribed GI cocktail 5-10 mL every 4-6 hours as needed for pain. 5.  Patient to follow in clinic per recommendations from Dr. Carlean Purl after time of procedure.  Ellouise Newer, PA-C [facility] 08/24/2022, 8:22 AM  Cc: Mackie Pai, PA-C

## 2022-09-21 ENCOUNTER — Encounter: Payer: Self-pay | Admitting: Internal Medicine

## 2022-09-21 ENCOUNTER — Ambulatory Visit: Payer: PRIVATE HEALTH INSURANCE | Admitting: Internal Medicine

## 2022-09-21 VITALS — BP 153/93 | HR 70 | Temp 97.3°F | Resp 16 | Ht 67.0 in | Wt 225.0 lb

## 2022-09-21 DIAGNOSIS — K2970 Gastritis, unspecified, without bleeding: Secondary | ICD-10-CM | POA: Diagnosis present

## 2022-09-21 DIAGNOSIS — K449 Diaphragmatic hernia without obstruction or gangrene: Secondary | ICD-10-CM

## 2022-09-21 DIAGNOSIS — K319 Disease of stomach and duodenum, unspecified: Secondary | ICD-10-CM

## 2022-09-21 DIAGNOSIS — K3189 Other diseases of stomach and duodenum: Secondary | ICD-10-CM

## 2022-09-21 DIAGNOSIS — R1013 Epigastric pain: Secondary | ICD-10-CM

## 2022-09-21 MED ORDER — SODIUM CHLORIDE 0.9 % IV SOLN: 500.0000 mL | Freq: Once | INTRAVENOUS | Status: DC

## 2022-09-21 NOTE — Progress Notes (Signed)
History and Physical Interval Note:  09/21/2022 10:01 AM  [name]  has presented today for endoscopic procedure(s), with the diagnosis of  Encounter Diagnosis  Name Primary?   Abdominal pain, epigastric Yes  .  The various methods of evaluation and treatment have been discussed with the patient and/or family. After consideration of risks, benefits and other options for treatment, the patient has consented to  the endoscopic procedure(s).   The patient's history has been reviewed, patient examined, no change in status, stable for endoscopic procedure(s).  I have reviewed the patient's chart and labs.  Questions were answered to the patient's satisfaction.     Gatha Mayer, MD, [facility]

## 2022-09-21 NOTE — Patient Instructions (Addendum)
There were signs of inflammation in the stomach - biopsies taken to learn more. Will contact you with results and next steps. Small hiatal hernia also - read the handout. I do not think that is related to your symptoms.  During the procedure we used a jaw thrust maneuver to help with keeping oxygen levels up - you may be sore there. Ibuprofen or tylenol ok if needed.   I appreciate the opportunity to care for you. Gatha Mayer, MD, [facility]  Resume all of your previous medications.  Read all of the handouts given to you by your recovery room nurse.  You may have a sore jaw due to the jaw thrust that we had to use during the procedure.  YOU HAD AN ENDOSCOPIC PROCEDURE TODAY AT [facility]:   Refer to the procedure report that was given to you for any specific questions about what was found during the examination.  If the procedure report does not answer your questions, please call your gastroenterologist to clarify.  If you requested that your care partner not be given the details of your procedure findings, then the procedure report has been included in a sealed envelope for you to review at your convenience later.  YOU SHOULD EXPECT: Some feelings of bloating in the abdomen. Passage of more gas than usual.  Walking can help get rid of the air that was put into your GI tract during the procedure and reduce the bloating. If you had a lower endoscopy (such as a colonoscopy or flexible sigmoidoscopy) you may notice spotting of blood in your stool or on the toilet paper. If you underwent a bowel prep for your procedure, you may not have a normal bowel movement for a few days.  Please Note:  You might notice some irritation and congestion in your nose or some drainage.  This is from the oxygen used during your procedure.  There is no need for concern and it should clear up in a day or so.  SYMPTOMS TO REPORT IMMEDIATELY:   Following upper endoscopy (EGD)  Vomiting of blood or coffee  ground material  New chest pain or pain under the shoulder blades  Painful or persistently difficult swallowing  New shortness of breath  Fever of 100F or higher  Black, tarry-looking stools  For urgent or emergent issues, a gastroenterologist can be reached at any hour by calling [phone]. Do not use MyChart messaging for urgent concerns.    DIET:  We do recommend a small meal at first, but then you may proceed to your regular diet.  Drink plenty of fluids but you should avoid alcoholic beverages for 24 hours.  Try to avoid acidic foods and drinks.  ACTIVITY:  You should plan to take it easy for the rest of today and you should NOT DRIVE or use heavy machinery until tomorrow (because of the sedation medicines used during the test).    FOLLOW UP: Our staff will call the number listed on your records the next business day following your procedure.  We will call around 7:15- 8:00 am to check on you and address any questions or concerns that you may have regarding the information given to you following your procedure. If we do not reach you, we will leave a message.     If any biopsies were taken you will be contacted by phone or by letter within the next 1-3 weeks.  Please call us at [phone] if you have not heard about  the biopsies in 3 weeks.    SIGNATURES/CONFIDENTIALITY: You and/or your care partner have signed paperwork which will be entered into your electronic medical record.  These signatures attest to the fact that that the information above on your After Visit Summary has been reviewed and is understood.  Full responsibility of the confidentiality of this discharge information lies with you and/or your care-partner.

## 2022-09-21 NOTE — Op Note (Signed)
[facility] Patient Name: [name] Procedure Date: 09/21/2022 9:57 AM MRN: [id number] Endoscopist: Gatha Mayer , MD, [id number] Age: 62 Referring MD:  Date of Birth: 01/01/1961 Gender: Female Account #: [id number] Procedure:                Upper GI endoscopy Indications:              Epigastric abdominal pain Medicines:                Monitored Anesthesia Care Procedure:                Pre-Anesthesia Assessment:                           - Prior to the procedure, a History and Physical                            was performed, and patient medications and                            allergies were reviewed. The patient's tolerance of                            previous anesthesia was also reviewed. The risks                            and benefits of the procedure and the sedation                            options and risks were discussed with the patient.                            All questions were answered, and informed consent                            was obtained. Prior Anticoagulants: The patient has                            taken no anticoagulant or antiplatelet agents. ASA                            Grade Assessment: II - A patient with mild systemic                            disease. After reviewing the risks and benefits,                            the patient was deemed in satisfactory condition to                            undergo the procedure.                           After obtaining informed consent, the endoscope was  passed under direct vision. Throughout the                            procedure, the patient's blood pressure, pulse, and                            oxygen saturations were monitored continuously. The                            GIF [id number] was introduced through the                            mouth, and advanced to the second part of duodenum.                            The upper GI endoscopy was  accomplished without                            difficulty. The patient tolerated the procedure                            fairly well. Scope In: Scope Out: Findings:                 Diffuse moderate inflammation characterized by                            erythema and granularity + enlarged folds was found                            in the gastric antrum. Biopsies were taken with a                            cold forceps for histology. Verification of patient                            identification for the specimen was done. Estimated                            blood loss was minimal.                           A 4 cm hiatal hernia was present.                           The exam was otherwise without abnormality except                            irregular Z-line                           The cardia and gastric fundus were normal on                            retroflexion.  Biopsies were taken with a cold forceps on the                            anterior wall of the gastric body, on the greater                            curvature of the gastric body, on the lesser                            curvature of the gastric body and on the posterior                            wall of the gastric body for histology.                            Verification of patient identification for the                            specimen was done. Estimated blood loss was minimal. Complications:            No immediate complications. Estimated Blood Loss:     Estimated blood loss was minimal. Impression:               - Gastritis. Biopsied.                           - 4 cm hiatal hernia. 35-39 cm                           - The examination was otherwise normal - Z line was                            slightly irregular.                           - Biopsies were taken with a cold forceps for                            histology on the anterior wall of the gastric body,                             on the greater curvature of the gastric body, on                            the lesser curvature of the gastric body and on the                            posterior wall of the gastric body. Recommendation:           - Patient has a contact number available for                            emergencies. The signs and symptoms of potential  delayed complications were discussed with the                            patient. Return to normal activities tomorrow.                            Written discharge instructions were provided to the                            patient.                           - Resume previous diet.                           - Continue present medications.                           - Await pathology results. She has had negative CT                            abd/pelvis (except adrenal adenoma) in 06/2022                            alkso negative TTG Ab (FHx celiac) Gatha Mayer, MD 09/21/2022 10:30:08 AM This report has been signed electronically.

## 2022-09-21 NOTE — Progress Notes (Signed)
Called to room to assist during endoscopic procedure.  Patient ID and intended procedure confirmed with present staff. Received instructions for my participation in the procedure from the performing physician.  

## 2022-09-21 NOTE — Progress Notes (Signed)
After administration of Propofol sedation, patient opening and closing eyes and mouth (with oral bite block in place) repeatedly and rapidly. Patient did this throughout procedure, despite adequate dosage of sedation. Eyes and lips watched with no noted biting of lip or complications involving eyes. Following procedure, patient has no recall of case during sedation. Jaw thrust performed, so patient may have a sore jaw. Otherwise, no intra-op issue noted.  Report to pacu rn. Vss. Care resumed by rn.

## 2022-09-22 ENCOUNTER — Telehealth: Payer: Self-pay | Admitting: *Deleted

## 2022-09-22 NOTE — Telephone Encounter (Signed)
Post procedure follow up phone call. No answer at number given.  There was no voicemail availability.

## 2022-10-02 ENCOUNTER — Other Ambulatory Visit: Payer: Self-pay

## 2022-10-02 DIAGNOSIS — R1012 Left upper quadrant pain: Secondary | ICD-10-CM

## 2022-10-02 MED ORDER — DICYCLOMINE HCL 10 MG PO CAPS: 10.0000 mg | ORAL_CAPSULE | Freq: Every day | ORAL | 0 refills | Status: DC

## 2022-10-31 ENCOUNTER — Encounter: Payer: Self-pay | Admitting: Medical

## 2022-10-31 ENCOUNTER — Telehealth (HOSPITAL_BASED_OUTPATIENT_CLINIC_OR_DEPARTMENT_OTHER): Payer: Self-pay

## 2022-10-31 ENCOUNTER — Ambulatory Visit (HOSPITAL_BASED_OUTPATIENT_CLINIC_OR_DEPARTMENT_OTHER)
Admission: RE | Admit: 2022-10-31 | Discharge: 2022-10-31 | Disposition: A | Discharge status: Home / Self Care | Payer: PRIVATE HEALTH INSURANCE | Source: Ambulatory Visit | Attending: Medical | Admitting: Medical

## 2022-10-31 ENCOUNTER — Ambulatory Visit (INDEPENDENT_AMBULATORY_CARE_PROVIDER_SITE_OTHER): Payer: PRIVATE HEALTH INSURANCE | Admitting: Medical

## 2022-10-31 VITALS — BP 160/85 | HR 80 | Resp 18 | Ht 67.0 in | Wt 242.0 lb

## 2022-10-31 DIAGNOSIS — I10 Essential (primary) hypertension: Secondary | ICD-10-CM

## 2022-10-31 DIAGNOSIS — R1012 Left upper quadrant pain: Secondary | ICD-10-CM

## 2022-10-31 DIAGNOSIS — R635 Abnormal weight gain: Secondary | ICD-10-CM | POA: Insufficient documentation

## 2022-10-31 DIAGNOSIS — F419 Anxiety disorder, unspecified: Secondary | ICD-10-CM

## 2022-10-31 DIAGNOSIS — M79662 Pain in left lower leg: Secondary | ICD-10-CM | POA: Insufficient documentation

## 2022-10-31 DIAGNOSIS — R600 Localized edema: Secondary | ICD-10-CM

## 2022-10-31 LAB — COMPREHENSIVE METABOLIC PANEL
AG Ratio: 1.6 (calc) (ref 1.0–2.5)
ALT: 9 U/L (ref 6–29)
AST: 17 U/L (ref 10–35)
Albumin: 4.1 g/dL (ref 3.6–5.1)
Alkaline phosphatase (APISO): 60 U/L (ref 37–153)
BUN/Creatinine Ratio: 16 (calc) (ref 6–22)
BUN: 21 mg/dL (ref 7–25)
CO2: 30 mmol/L (ref 20–32)
Calcium: 9 mg/dL (ref 8.6–10.4)
Chloride: 105 mmol/L (ref 98–110)
Creat: 1.32 mg/dL — ABNORMAL HIGH (ref 0.50–1.03)
Globulin: 2.6 g/dL (calc) (ref 1.9–3.7)
Glucose, Bld: 107 mg/dL — ABNORMAL HIGH (ref 65–99)
Potassium: 4.6 mmol/L (ref 3.5–5.3)
Sodium: 142 mmol/L (ref 135–146)
Total Bilirubin: 0.5 mg/dL (ref 0.2–1.2)
Total Protein: 6.7 g/dL (ref 6.1–8.1)

## 2022-10-31 LAB — BRAIN NATRIURETIC PEPTIDE: Brain Natriuretic Peptide: 240 pg/mL — ABNORMAL HIGH (ref <100)

## 2022-10-31 MED ORDER — ALPRAZOLAM 0.5 MG PO TABS: 0.5000 mg | ORAL_TABLET | Freq: Two times a day (BID) | ORAL | 0 refills | Status: DC | PRN

## 2022-10-31 NOTE — Progress Notes (Signed)
Subjective:    Patient ID: [name], female    DOB: [birth year], 62 y.o.   MRN: [id number]  HPI  Pt in for bilateral lower extremity swelling over past 3-4 weeks. States swelling is not severe now. She states weight gain of 20 minutes in past month. No sob.   Pt stats I ordered echo or someone else ordered?? He states told study was normal. I can't find echo report in [facility] or in [facility].  Pt states swelling is worse in the morning until 9am. Some pain in upper calf.    Weight 08-11-22 was 233 lb.  Today wt is 242 lb.   Htn- pt is on valsartan 320 mg daily. She is hydralazene 3 tabs a day.   Pt is anxious. She is on xanax. Had tabs from former pcp. Recent stress with mom elderly and very sick.  Pt was given script for gi cocktail to use at home. She states had issues.    Review of Systems  Constitutional:  Negative for chills, fatigue and fever.  Respiratory:  Negative for cough, chest tightness, shortness of breath and wheezing.   Cardiovascular:  Negative for chest pain and palpitations.  Gastrointestinal:  Negative for anal bleeding.  Musculoskeletal:  Negative for back pain.       Lower extremity pedal edema. Bilaterally.   Skin:  Negative for rash.  Hematological:  Negative for adenopathy. Does not bruise/bleed easily.  Psychiatric/Behavioral:  Negative for behavioral problems, confusion and dysphoric mood. The patient is not nervous/anxious.     Past Medical History:  Diagnosis Date   Anxiety    GAD   Arthritis    Chronic kidney disease    lesion on left adrenal gland   Depression    in late 90s   Encounter for routine gynecological examination    Dr. Gaetano Net   Endometriosis    Dr. Gertie Fey   Genital herpes    no recent outbreaks, no med   GERD (gastroesophageal reflux disease)    Rosanna Randy disease    patient denies    H/O echocardiogram 06/14/2006   LV EF 55-60%, left atrium midly dilated, othewrise normal   History of constipation    prior  with bread/grains, uses miralax prn with good results   History of EKG 07/19/2010   normal EKG   Hypertension    Migraine    Obesity    Osteoporosis 2019   eval and treatment through gyn, Raloxifene   Routine gynecological examination    sees gynecology yearly   Smoker    Traumatic arthritis of ankle    right -   Wears glasses      Social History   Socioeconomic History   Marital status: Married    Spouse name: Not on file   Number of children: Not on file   Years of education: Not on file   Highest education level: Not on file  Occupational History   Not on file  Tobacco Use   Smoking status: Every Day    Packs/day: .75    Types: Cigarettes   Smokeless tobacco: Never  Vaping Use   Vaping Use: Never used  Substance and Sexual Activity   Alcohol use: Yes    Alcohol/week: 21.0 standard drinks of alcohol    Types: 21 Shots of liquor per week   Drug use: No   Sexual activity: Yes    Birth control/protection: Post-menopausal  Other Topics Concern   Not on file  Social History Narrative  Married again 12/2017, prior separated.   Walks an hour daily for exercise.   Works at [employer] Strain: Not on [employer] Insecurity: Not on file  Transportation Needs: Not on file  Physical Activity: Not on file  Stress: Not on file  Social Connections: Not on file  Intimate Partner Violence: Not on file    Past Surgical History:  Procedure Laterality Date   ANKLE SURGERY  2010   right, ORIF, s/p trauma   CESAREAN SECTION     x 1   CHALAZION EXCISION     COLONOSCOPY  11/16   2 polyps, mild diverticulosis, repeat in 10 years; Dr. Silvano Rusk   HYSTEROSCOPY     endometriosis   left breast surgery     removed benign cyst   WISDOM TOOTH EXTRACTION      Family History  Problem Relation Age of Onset   Cancer Mother        skin   Heart disease Mother    Hypertension Mother    Hypertension Father    Cancer  Father        mouth cancer   Cancer Brother 57       colon   Colon cancer Brother 62   Cancer Paternal Aunt    Diabetes Paternal Grandmother    Stroke Paternal Grandfather    Diabetes Paternal Grandfather    Colon polyps Neg Hx    Esophageal cancer Neg Hx    Rectal cancer Neg Hx    Stomach cancer Neg Hx    Breast cancer Neg Hx     Allergies  Allergen Reactions   Ace Inhibitors Cough   Amlodipine Swelling   Hydrochlorothiazide Other (See Comments)    Felt "bad"   Lisinopril Cough   Other     Eye dilation dye    Current Outpatient Medications on File Prior to Visit  Medication Sig Dispense Refill   ALPRAZolam (XANAX) 0.5 MG tablet Take by mouth.     AMBULATORY NON FORMULARY MEDICATION Medication Name: GI Cocktail  Sig: 90 ML Viscous Lidocaine, 90 ML -10ML/20ml Dicyclomine- 270 ML Maalox   Swish and Swallow 30 ML by mouth four times daily (Patient not taking: Reported on 09/21/2022) 450 mL 0   aspirin 81 MG tablet Take 1 tablet (81 mg total) by mouth daily. 90 tablet 3   Cholecalciferol ([employer] VIT D 5000 HIGH-POTENCY PO) Take 5,000 Units by mouth daily.     dicyclomine (BENTYL) 10 MG capsule Take 1 capsule (10 mg total) by mouth daily with breakfast. 90 capsule 0   hydrALAZINE (APRESOLINE) 25 MG tablet Take 1 tablet (25 mg total) by mouth 3 (three) times daily. 90 tablet 3   Nebivolol HCl 20 MG TABS Take 2 tablets (40 mg total) by mouth daily. 180 tablet 3   omeprazole (PRILOSEC) 20 MG capsule Take 1 capsule (20 mg total) by mouth 2 (two) times daily before a meal. 60 capsule 2   raloxifene (EVISTA) 60 MG tablet Take 60 mg by mouth daily.     UNABLE TO FIND Med Name: ultra flora     UNABLE TO FIND Take 55 mg by mouth daily. Med Name: ba;ance probiotic     valsartan (DIOVAN) 320 MG tablet Take 1 tablet (320 mg total) by mouth daily. 90 tablet 3   No current facility-administered medications on file prior to visit.    BP (!) 160/85   Pulse 80  Resp 18   Ht 5\' 7"  (1.702 m)    Wt 242 lb (109.8 kg)   LMP 06/07/2011   SpO2 98%   BMI 37.90 kg/m        Objective:   Physical Exam  General Mental Status- Alert. General Appearance- Not in acute distress.   Skin General: Color- Normal Color. Moisture- Normal Moisture.  Neck Carotid Arteries- Normal color. Moisture- Normal Moisture. No carotid bruits. No JVD.  Chest and Lung Exam Auscultation: Breath Sounds:-Normal.  Cardiovascular Auscultation:Rythm- Regular. Murmurs & Other Heart Sounds:Auscultation of the heart reveals- No Murmurs.  Abdomen Inspection:-Inspeection Normal. Palpation/Percussion:Note:No mass. Palpation and Percussion of the abdomen reveal- Non Tender, Non Distended + BS, no rebound or guarding.    Neurologic Cranial Nerve exam:- CN III-XII intact(No nystagmus), symmetric smile. Drift Test:- No drift. Romberg Exam:- Negative.  Heal to Toe Gait exam:-Normal. Finger to Nose:- Normal/Intact Strength:- 5/5 equal and symmetric strength both upper and lower extremities.    Bilateral 1+ pedal edema presently. Calfs symmetric. Mild left calf tender upper aspect and mild fullness to popliteal area. Faint pretibal area pain on palpation as well. No redness, no warmth.no dilated veins.     Assessment & Plan:   Patient Instructions  Pedal edema -     DG Chest 2 View; Future -     Comprehensive metabolic panel -     Brain natriuretic peptide -     US Venous Img Lower Unilateral Left (DVT); Future  Weight gain -     DG Chest 2 View; Future  Pain of left calf -     US Venous Img Lower Unilateral Left (DVT); Future  Essential hypertension  Anxiety  Other orders -     ALPRAZolam; Take 1 tablet (0.5 mg total) by mouth 2 (two) times daily as needed for anxiety.  Dispense: 15 tablet; Refill: 0    Will get above labs and imaging studies. Want all done today. Including the lower ext [place].  Normal neurologic exam. .If cxr shows chf or labs show chf will switch to lasix. Presenlty add  extra hydralzine 25 mg tonight to current 25 mg dose. Update me on bp reading tomorrow morning.  Follow up in 9 days or sooner if needed.     Mackie Pai, PA-C   Time spent with patient today was  40 minutes which consisted of chart revdiew, discussing diagnosis, work up treatment and documentation.

## 2022-10-31 NOTE — Patient Instructions (Addendum)
Pedal edema -     DG Chest 2 View; Future -     Comprehensive metabolic panel -     Brain natriuretic peptide -     US Venous Img Lower Unilateral Left (DVT); Future  Weight gain -     DG Chest 2 View; Future  Pain of left calf -     US Venous Img Lower Unilateral Left (DVT); Future  Essential hypertension  Anxiety  Other orders -     ALPRAZolam; Take 1 tablet (0.5 mg total) by mouth 2 (two) times daily as needed for anxiety.  Dispense: 15 tablet; Refill: 0    Will get above labs and imaging studies. Want all done today. Including the lower ext [place].  Normal neurologic exam. .If cxr shows chf or labs show chf will switch to lasix. Presenlty add extra hydralzine 25 mg tonight to current 25 mg dose. Update me on bp reading tomorrow morning.  Also we discussed your abdomen pain. Recent work up done by gi MD. Dennis Bast described they sent in GI cocktail prescription. Would ask you call gi office for clarification and see if they will resend that rx.  Follow up in 9 days or sooner if needed.

## 2022-11-01 ENCOUNTER — Encounter: Payer: Self-pay | Admitting: Medical

## 2022-11-09 ENCOUNTER — Ambulatory Visit (HOSPITAL_BASED_OUTPATIENT_CLINIC_OR_DEPARTMENT_OTHER)
Admission: RE | Admit: 2022-11-09 | Discharge: 2022-11-09 | Disposition: A | Discharge status: Home / Self Care | Payer: PRIVATE HEALTH INSURANCE | Source: Ambulatory Visit | Attending: Medical | Admitting: Medical

## 2022-11-09 ENCOUNTER — Encounter: Payer: Self-pay | Admitting: Medical

## 2022-11-09 ENCOUNTER — Ambulatory Visit: Payer: PRIVATE HEALTH INSURANCE | Admitting: Medical

## 2022-11-09 VITALS — BP 170/90 | HR 82 | Resp 18 | Ht 67.0 in | Wt 230.0 lb

## 2022-11-09 DIAGNOSIS — R7989 Other specified abnormal findings of blood chemistry: Secondary | ICD-10-CM | POA: Diagnosis not present

## 2022-11-09 DIAGNOSIS — R1032 Left lower quadrant pain: Secondary | ICD-10-CM

## 2022-11-09 DIAGNOSIS — R600 Localized edema: Secondary | ICD-10-CM | POA: Diagnosis not present

## 2022-11-09 DIAGNOSIS — I10 Essential (primary) hypertension: Secondary | ICD-10-CM | POA: Diagnosis not present

## 2022-11-09 LAB — CBC WITH DIFFERENTIAL/PLATELET
Basophils Absolute: 0.1 10*3/uL (ref 0.0–0.1)
Basophils Relative: 0.7 % (ref 0.0–3.0)
Eosinophils Absolute: 0.2 10*3/uL (ref 0.0–0.7)
Eosinophils Relative: 2.9 % (ref 0.0–5.0)
HCT: 44.7 % (ref 36.0–46.0)
Hemoglobin: 15.1 g/dL — ABNORMAL HIGH (ref 12.0–15.0)
Lymphocytes Relative: 16.7 % (ref 12.0–46.0)
Lymphs Abs: 1.3 10*3/uL (ref 0.7–4.0)
MCHC: 33.8 g/dL (ref 30.0–36.0)
MCV: 100.2 fl — ABNORMAL HIGH (ref 78.0–100.0)
Monocytes Absolute: 0.7 10*3/uL (ref 0.1–1.0)
Monocytes Relative: 9.3 % (ref 3.0–12.0)
Neutro Abs: 5.4 10*3/uL (ref 1.4–7.7)
Neutrophils Relative %: 70.4 % (ref 43.0–77.0)
Platelets: 255 10*3/uL (ref 150.0–400.0)
RBC: 4.46 Mil/uL (ref 3.87–5.11)
RDW: 14 % (ref 11.5–15.5)
WBC: 7.7 10*3/uL (ref 4.0–10.5)

## 2022-11-09 LAB — COMPREHENSIVE METABOLIC PANEL
ALT: 13 U/L (ref 0–35)
AST: 19 U/L (ref 0–37)
Albumin: 4.2 g/dL (ref 3.5–5.2)
Alkaline Phosphatase: 72 U/L (ref 39–117)
BUN: 23 mg/dL (ref 6–23)
CO2: 28 mEq/L (ref 19–32)
Calcium: 9.4 mg/dL (ref 8.4–10.5)
Chloride: 101 mEq/L (ref 96–112)
Creatinine, Ser: 1.11 mg/dL (ref 0.40–1.20)
GFR: 54.47 mL/min — ABNORMAL LOW (ref >60.00)
Glucose, Bld: 86 mg/dL (ref 70–99)
Potassium: 5.2 mEq/L — ABNORMAL HIGH (ref 3.5–5.1)
Sodium: 137 mEq/L (ref 135–145)
Total Bilirubin: 0.7 mg/dL (ref 0.2–1.2)
Total Protein: 7.1 g/dL (ref 6.0–8.3)

## 2022-11-09 LAB — BRAIN NATRIURETIC PEPTIDE: Pro B Natriuretic peptide (BNP): 133 pg/mL — ABNORMAL HIGH (ref 0.0–100.0)

## 2022-11-09 MED ORDER — FUROSEMIDE 20 MG PO TABS: ORAL_TABLET | ORAL | 0 refills | Status: DC

## 2022-11-09 MED ORDER — HYDRALAZINE HCL 100 MG PO TABS: 100.0000 mg | ORAL_TABLET | Freq: Two times a day (BID) | ORAL | 3 refills | Status: DC

## 2022-11-09 MED ORDER — CIPROFLOXACIN HCL 500 MG PO TABS: 500.0000 mg | ORAL_TABLET | Freq: Two times a day (BID) | ORAL | 0 refills | Status: DC

## 2022-11-09 MED ORDER — METRONIDAZOLE 500 MG PO TABS: 500.0000 mg | ORAL_TABLET | Freq: Three times a day (TID) | ORAL | 0 refills | Status: AC

## 2022-11-09 NOTE — Progress Notes (Signed)
Subjective:    Patient ID: [name], female    DOB: Apr 06, 1961, 62 y.o.   MRN: [id number]  HPI  Pt in for follow   "Pedal edema -     DG Chest 2 View; Future -     Comprehensive metabolic panel -     Brain natriuretic peptide -     US Venous Img Lower Unilateral Left (DVT); Future   Weight gain -     DG Chest 2 View; Future   Pain of left calf -     US Venous Img Lower Unilateral Left (DVT); Future   Essential hypertension   Anxiety   Other orders -     ALPRAZolam; Take 1 tablet (0.5 mg total) by mouth 2 (two) times daily as needed for anxiety.  Dispense: 15 tablet; Refill: 0     Will get above labs and imaging studies. Want all done today. Including the lower ext [place].   Normal neurologic exam. .If cxr shows chf or labs show chf will switch to lasix. Presenlty add extra hydralzine 25 mg tonight to current 25 mg dose. Update me on bp reading tomorrow morning."   Pt states her legs are still swelling.  Labs bnp was slight elevated.  Cxr did show mild enlarged cardiac silloutte. EF back in December was 55-60%.  Pt thought left leg was more swollen last visit. He [place] did not show DVT.  Pt swelling after exercise at times. At the end of the day swelling is worse. She tried compression socks. On weekends not swollen as much as can keep legs up.  Htn- bp the other day was 189/112. Pt is now on hydralazine 50 mg tid. Valsartan 320 mg daily and nebivilol 40 mg daily.  Pt has appt with endocrinologist November 29, 2022.         Review of Systems  Constitutional:  Negative for chills, fatigue and fever.  Respiratory:  Negative for cough, chest tightness, shortness of breath and wheezing.   Cardiovascular:  Negative for chest pain.  Gastrointestinal:  Positive for abdominal pain. Negative for blood in stool, diarrhea and nausea.       Mild llq pain intermittent and sharp for one second or so but 2-3 times a day. Known diverticultosis on prior colonoscopy per pt but  location undeterined. Can't find report  Genitourinary:  Negative for dysuria, flank pain and frequency.  Musculoskeletal:  Negative for back pain.  Skin:  Negative for rash.  Hematological:  Negative for adenopathy. Does not bruise/bleed easily.  Psychiatric/Behavioral:  Negative for behavioral problems, confusion and hallucinations.    Past Medical History:  Diagnosis Date   Anxiety    GAD   Arthritis    Chronic kidney disease    lesion on left adrenal gland   Depression    in late 90s   Encounter for routine gynecological examination    Dr. Gaetano Net   Endometriosis    Dr. Gertie Fey   Genital herpes    no recent outbreaks, no med   GERD (gastroesophageal reflux disease)    Rosanna Randy disease    patient denies    H/O echocardiogram 06/14/2006   LV EF 55-60%, left atrium midly dilated, othewrise normal   History of constipation    prior with bread/grains, uses miralax prn with good results   History of EKG 07/19/2010   normal EKG   Hypertension    Migraine    Obesity    Osteoporosis 2019   eval and treatment through  gyn, Raloxifene   Routine gynecological examination    sees gynecology yearly   Smoker    Traumatic arthritis of ankle    right -   Wears glasses      Social History   Socioeconomic History   Marital status: Married    Spouse name: Not on file   Number of children: Not on file   Years of education: Not on file   Highest education level: Not on file  Occupational History   Not on file  Tobacco Use   Smoking status: Every Day    Packs/day: .75    Types: Cigarettes   Smokeless tobacco: Never  Vaping Use   Vaping Use: Never used  Substance and Sexual Activity   Alcohol use: Yes    Alcohol/week: 21.0 standard drinks of alcohol    Types: 21 Shots of liquor per week   Drug use: No   Sexual activity: Yes    Birth control/protection: Post-menopausal  Other Topics Concern   Not on file  Social History Narrative   Married again 12/2017, prior separated.    Walks an hour daily for exercise.   Works at [employer] Strain: Not on [employer] Insecurity: Not on file  Transportation Needs: Not on file  Physical Activity: Not on file  Stress: Not on file  Social Connections: Not on file  Intimate Partner Violence: Not on file    Past Surgical History:  Procedure Laterality Date   ANKLE SURGERY  2010   right, ORIF, s/p trauma   CESAREAN SECTION     x 1   CHALAZION EXCISION     COLONOSCOPY  11/16   2 polyps, mild diverticulosis, repeat in 10 years; Dr. Silvano Rusk   HYSTEROSCOPY     endometriosis   left breast surgery     removed benign cyst   WISDOM TOOTH EXTRACTION      Family History  Problem Relation Age of Onset   Cancer Mother        skin   Heart disease Mother    Hypertension Mother    Hypertension Father    Cancer Father        mouth cancer   Cancer Brother 23       colon   Colon cancer Brother 62   Cancer Paternal Aunt    Diabetes Paternal Grandmother    Stroke Paternal Grandfather    Diabetes Paternal Grandfather    Colon polyps Neg Hx    Esophageal cancer Neg Hx    Rectal cancer Neg Hx    Stomach cancer Neg Hx    Breast cancer Neg Hx     Allergies  Allergen Reactions   Ace Inhibitors Cough   Amlodipine Swelling   Hydrochlorothiazide Other (See Comments)    Felt "bad"   Lisinopril Cough   Other     Eye dilation dye    Current Outpatient Medications on File Prior to Visit  Medication Sig Dispense Refill   ALPRAZolam (XANAX) 0.5 MG tablet Take by mouth.     ALPRAZolam (XANAX) 0.5 MG tablet Take 1 tablet (0.5 mg total) by mouth 2 (two) times daily as needed for anxiety. 15 tablet 0   aspirin 81 MG tablet Take 1 tablet (81 mg total) by mouth daily. 90 tablet 3   Cholecalciferol (CVS VIT D 5000 HIGH-POTENCY PO) Take 5,000 Units by mouth daily.     dicyclomine (BENTYL) 10 MG capsule  Take 1 capsule (10 mg total) by mouth daily with breakfast. 90  capsule 0   hydrALAZINE (APRESOLINE) 25 MG tablet Take 1 tablet (25 mg total) by mouth 3 (three) times daily. 90 tablet 3   Nebivolol HCl 20 MG TABS Take 2 tablets (40 mg total) by mouth daily. 180 tablet 3   omeprazole (PRILOSEC) 20 MG capsule Take 1 capsule (20 mg total) by mouth 2 (two) times daily before a meal. 60 capsule 2   raloxifene (EVISTA) 60 MG tablet Take 60 mg by mouth daily.     UNABLE TO FIND Med Name: ultra flora     UNABLE TO FIND Take 55 mg by mouth daily. Med Name: ba;ance probiotic     valsartan (DIOVAN) 320 MG tablet Take 1 tablet (320 mg total) by mouth daily. 90 tablet 3   No current facility-administered medications on file prior to visit.    BP (!) 170/90   Pulse 82   Resp 18   Ht 5\' 7"  (1.702 m)   Wt 230 lb (104.3 kg)   LMP 06/07/2011   SpO2 100%   BMI 36.02 kg/m        Objective:   Physical Exam  General Mental Status- Alert. General Appearance- Not in acute distress.   Skin General: Color- Normal Color. Moisture- Normal Moisture.  Neck Carotid Arteries- Normal color. Moisture- Normal Moisture. No carotid bruits. No JVD.  Chest and Lung Exam Auscultation: Breath Sounds:-Normal.  Cardiovascular Auscultation:Rythm- Regular. Murmurs & Other Heart Sounds:Auscultation of the heart reveals- No Murmurs.  Abdomen Inspection:-Inspeection Normal. Palpation/Percussion:Note:No mass. Palpation and Percussion of the abdomen reveal- Non Tender, Non Distended + BS, no rebound or guarding.   Neurologic Cranial Nerve exam:- CN III-XII intact(No nystagmus), symmetric smile. Strength:- 5/5 equal and symmetric strength both upper and lower extremities.   Lower ext- no pedal edema. Presenlty. Negative homans signs.    Assessment & Plan:   Patient Instructions  1. Pedal edema Dependent edema likely but will need to make sure no CHF component.  Repeating chest x-ray and labs today.  Do recommend continuing compression socks.  Might need to change  brand/type.  Elevate legs is much as possible.  Will make low-dose Lasix 20 mg available to use Mondays Wednesdays and Fridays if needed.  Unfortunately you report side effect with HCTZ so could not prescribe that or Maxide. - B Nat Peptide - Comp Met (CMET) - DG Chest 2 View; Future  2. Elevated brain natriuretic peptide (BNP) level - B Nat Peptide - DG Chest 2 View; Future  3. Hypertension, unspecified type Increasing hydralazine to 100 mg twice daily.  Continue other BP meds the same. With such a high blood pressure if your blood pressure is not decreasing let me know.  Start to check daily.  If you have any cardiac or neurologic signs symptoms be seen in the emergency department.  4. Left lower quadrant abdominal pain Your left lower quadrant pain is intermittent and sharp lasting for seconds.  Will follow your infection fighting cells.  If your pain becomes constant then go ahead and start Flagyl and Cipro.  If you do start antibiotics and the pain is persisting let me know and we will need to get CT abdomen and pelvis.  Any severe pain is constant despite treatment advised to be seen in the emergency department.  - CBC w/Diff   Follow-up in 10 days or sooner if needed.   Mackie Pai, PA-C   Time spent with patient today  was 40 minutes which consisted of chart revdiew, discussing diagnosis, work up treatment and documentation.

## 2022-11-09 NOTE — Patient Instructions (Addendum)
1. Pedal edema Dependent edema likely but will need to make sure no CHF component.  Repeating chest x-ray and labs today.  Do recommend continuing compression socks.  Might need to change brand/type.  Elevate legs is much as possible.  Will make low-dose Lasix 20 mg available to use Mondays Wednesdays and Fridays if needed.  Unfortunately you report side effect with HCTZ so could not prescribe that or Maxide. - B Nat Peptide - Comp Met (CMET) - DG Chest 2 View; Future  2. Elevated brain natriuretic peptide (BNP) level - B Nat Peptide - DG Chest 2 View; Future  3. Hypertension, unspecified type Increasing hydralazine to 100 mg twice daily.  Continue other BP meds the same. With such a high blood pressure if your blood pressure is not decreasing let me know.  Start to check daily.  If you have any cardiac or neurologic signs symptoms be seen in the emergency department.  4. Left lower quadrant abdominal pain Your left lower quadrant pain is intermittent and sharp lasting for seconds.  Will follow your infection fighting cells.  If your pain becomes constant then go ahead and start Flagyl and Cipro.  If you do start antibiotics and the pain is persisting let me know and we will need to get CT abdomen and pelvis.  Any severe pain is constant despite treatment advised to be seen in the emergency department.  - CBC w/Diff   Follow-up in 10 days or sooner if needed.

## 2022-11-10 ENCOUNTER — Encounter: Payer: Self-pay | Admitting: Medical

## 2022-11-11 ENCOUNTER — Other Ambulatory Visit: Payer: Self-pay | Admitting: Medical

## 2022-11-13 ENCOUNTER — Encounter: Payer: Self-pay | Admitting: Medical

## 2022-11-22 ENCOUNTER — Ambulatory Visit (INDEPENDENT_AMBULATORY_CARE_PROVIDER_SITE_OTHER): Payer: PRIVATE HEALTH INSURANCE | Admitting: Medical

## 2022-11-22 ENCOUNTER — Encounter: Payer: Self-pay | Admitting: Medical

## 2022-11-22 VITALS — BP 160/90 | HR 72 | Resp 18 | Ht 67.0 in | Wt 229.0 lb

## 2022-11-22 DIAGNOSIS — I10 Essential (primary) hypertension: Secondary | ICD-10-CM | POA: Diagnosis not present

## 2022-11-22 DIAGNOSIS — F419 Anxiety disorder, unspecified: Secondary | ICD-10-CM

## 2022-11-22 DIAGNOSIS — F439 Reaction to severe stress, unspecified: Secondary | ICD-10-CM | POA: Diagnosis not present

## 2022-11-22 DIAGNOSIS — G4700 Insomnia, unspecified: Secondary | ICD-10-CM

## 2022-11-22 DIAGNOSIS — E875 Hyperkalemia: Secondary | ICD-10-CM

## 2022-11-22 LAB — COMPREHENSIVE METABOLIC PANEL
ALT: 10 U/L (ref 0–35)
AST: 16 U/L (ref 0–37)
Albumin: 4 g/dL (ref 3.5–5.2)
Alkaline Phosphatase: 60 U/L (ref 39–117)
BUN: 24 mg/dL — ABNORMAL HIGH (ref 6–23)
CO2: 27 mEq/L (ref 19–32)
Calcium: 9.2 mg/dL (ref 8.4–10.5)
Chloride: 100 mEq/L (ref 96–112)
Creatinine, Ser: 1.23 mg/dL — ABNORMAL HIGH (ref 0.40–1.20)
GFR: 48.15 mL/min — ABNORMAL LOW (ref >60.00)
Glucose, Bld: 103 mg/dL — ABNORMAL HIGH (ref 70–99)
Potassium: 4.8 mEq/L (ref 3.5–5.1)
Sodium: 136 mEq/L (ref 135–145)
Total Bilirubin: 1.1 mg/dL (ref 0.2–1.2)
Total Protein: 6.8 g/dL (ref 6.0–8.3)

## 2022-11-22 MED ORDER — TRAZODONE HCL 50 MG PO TABS: 25.0000 mg | ORAL_TABLET | Freq: Every evening | ORAL | 3 refills | Status: AC | PRN

## 2022-11-22 NOTE — Progress Notes (Signed)
Subjective:    Patient ID: [name], female    DOB: 1960/09/29, 62 y.o.   MRN: [id number]  HPI  Pt in for follow up. Below is last visit AVS.  " 1. Pedal edema Dependent edema likely but will need to make sure no CHF component.  Repeating chest x-ray and labs today.  Do recommend continuing compression socks.  Might need to change brand/type.  Elevate legs is much as possible.  Will make low-dose Lasix 20 mg available to use Mondays Wednesdays and Fridays if needed.  Unfortunately you report side effect with HCTZ so could not prescribe that or Maxide. - B Nat Peptide - Comp Met (CMET) - DG Chest 2 View; Future   2. Elevated brain natriuretic peptide (BNP) level - B Nat Peptide - DG Chest 2 View; Future   3. Hypertension, unspecified type Increasing hydralazine to 100 mg twice daily.  Continue other BP meds the same. With such a high blood pressure if your blood pressure is not decreasing let me know.  Start to check daily.  If you have any cardiac or neurologic signs symptoms be seen in the emergency department.   4. Left lower quadrant abdominal pain Your left lower quadrant pain is intermittent and sharp lasting for seconds.  Will follow your infection fighting cells.  If your pain becomes constant then go ahead and start Flagyl and Cipro.  If you do start antibiotics and the pain is persisting let me know and we will need to get CT abdomen and pelvis.  Any severe pain is constant despite treatment advised to be seen in the emergency department.   - CBC w/Diff "   Last cxr on 11-11-2022   IMPRESSION: Unchanged mild cardiomegaly without evidence of pulmonary edema.  Last lab k was mild elevated. Bnp was mild high.    My last advise on htn was for pt to use hydralazine 100 mg twice daily.  Continue valsartan 320 mg daily.  Pt states when she increase her hydarlazine 100 mg twice a day. She states felt nausea and tired. So she went back to 50 mg tid. Pt also on  nebivolol.  Pt also had lasix 20 mg to use up to 3 times weekly for pedal edema.    Pt states she has been under a lot of stress related to work, husband and mother health who is 16 year old. Pt having to take xanax.  Pt recently has insomnia along with anxiety. Pt already on xanax.    Review of Systems  Constitutional:  Negative for chills, fatigue and fever.  Respiratory:  Negative for cough, chest tightness, shortness of breath and wheezing.   Cardiovascular:  Negative for chest pain and palpitations.  Gastrointestinal:  Negative for abdominal pain, anal bleeding, constipation, diarrhea and rectal pain.  Genitourinary:  Negative for dyspareunia and dysuria.  Musculoskeletal:  Negative for back pain, joint swelling and myalgias.  Skin:  Negative for rash.  Neurological:  Negative for dizziness, speech difficulty, weakness, numbness and headaches.  Hematological:  Negative for adenopathy. Does not bruise/bleed easily.  Psychiatric/Behavioral:  Positive for sleep disturbance. Negative for behavioral problems, confusion and suicidal ideas. The patient is nervous/anxious.     Past Medical History:  Diagnosis Date   Anxiety    GAD   Arthritis    Chronic kidney disease    lesion on left adrenal gland   Depression    in late 90s   Encounter for routine gynecological examination    Dr.  Tomblin   Endometriosis    Dr. Huntley Dec   Genital herpes    no recent outbreaks, no med   GERD (gastroesophageal reflux disease)    Sullivan Lone disease    patient denies    H/O echocardiogram 06/14/2006   LV EF 55-60%, left atrium midly dilated, othewrise normal   History of constipation    prior with bread/grains, uses miralax prn with good results   History of EKG 07/19/2010   normal EKG   Hypertension    Migraine    Obesity    Osteoporosis 2019   eval and treatment through gyn, Raloxifene   Routine gynecological examination    sees gynecology yearly   Smoker    Traumatic arthritis of ankle     right -   Wears glasses      Social History   Socioeconomic History   Marital status: Married    Spouse name: Not on file   Number of children: Not on file   Years of education: Not on file   Highest education level: Not on file  Occupational History   Not on file  Tobacco Use   Smoking status: Every Day    Packs/day: .75    Types: Cigarettes   Smokeless tobacco: Never  Vaping Use   Vaping Use: Never used  Substance and Sexual Activity   Alcohol use: Yes    Alcohol/week: 21.0 standard drinks of alcohol    Types: 21 Shots of liquor per week   Drug use: No   Sexual activity: Yes    Birth control/protection: Post-menopausal  Other Topics Concern   Not on file  Social History Narrative   Married again 12/2017, prior separated.   Walks an hour daily for exercise.   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 Stan Head   HYSTEROSCOPY     endometriosis   left breast surgery     removed benign cyst   WISDOM TOOTH EXTRACTION      Family History  Problem Relation Age of Onset   Cancer Mother        skin   Heart disease Mother    Hypertension Mother    Hypertension Father    Cancer Father        mouth cancer   Cancer Brother 70       colon   Colon cancer Brother 70   Cancer Paternal Aunt    Diabetes Paternal Grandmother    Stroke Paternal Grandfather    Diabetes Paternal Grandfather    Colon polyps Neg Hx    Esophageal cancer Neg Hx    Rectal cancer Neg Hx    Stomach cancer  Neg Hx    Breast cancer Neg Hx     Allergies  Allergen Reactions   Ace Inhibitors Cough   Amlodipine Swelling   Hydrochlorothiazide Other (See Comments)    Felt "bad"   Lisinopril Cough  Other     Eye dilation dye    Current Outpatient Medications on File Prior to Visit  Medication Sig Dispense Refill   ALPRAZolam (XANAX) 0.5 MG tablet Take by mouth.     ALPRAZolam (XANAX) 0.5 MG tablet Take 1 tablet (0.5 mg total) by mouth 2 (two) times daily as needed for anxiety. 15 tablet 0   aspirin 81 MG tablet Take 1 tablet (81 mg total) by mouth daily. 90 tablet 3   Cholecalciferol (CVS VIT D 5000 HIGH-POTENCY PO) Take 5,000 Units by mouth daily.     ciprofloxacin (CIPRO) 500 MG tablet Take 1 tablet (500 mg total) by mouth 2 (two) times daily. 14 tablet 0   dicyclomine (BENTYL) 10 MG capsule Take 1 capsule (10 mg total) by mouth daily with breakfast. 90 capsule 0   furosemide (LASIX) 20 MG tablet 1 tab po Monday, wed and friday 12 tablet 0   hydrALAZINE (APRESOLINE) 100 MG tablet Take 1 tablet (100 mg total) by mouth 2 (two) times daily. 60 tablet 3   hydrALAZINE (APRESOLINE) 25 MG tablet TAKE 1 TABLET BY MOUTH THREE TIMES DAILY 90 tablet 0   Nebivolol HCl 20 MG TABS Take 2 tablets (40 mg total) by mouth daily. 180 tablet 3   omeprazole (PRILOSEC) 20 MG capsule Take 1 capsule (20 mg total) by mouth 2 (two) times daily before a meal. 60 capsule 2   raloxifene (EVISTA) 60 MG tablet Take 60 mg by mouth daily.     UNABLE TO FIND Med Name: ultra flora     UNABLE TO FIND Take 55 mg by mouth daily. Med Name: ba;ance probiotic     valsartan (DIOVAN) 320 MG tablet Take 1 tablet (320 mg total) by mouth daily. 90 tablet 3   No current facility-administered medications on file prior to visit.    BP (!) 160/90   Pulse 72   Resp 18   Ht 5\' 7"  (1.702 m)   Wt 229 lb (103.9 kg)   LMP 06/07/2011   SpO2 100%   BMI 35.87 kg/m           Objective:   Physical Exam  General Mental Status-  Alert. General Appearance- Not in acute distress.   Skin General: Color- Normal Color. Moisture- Normal Moisture.  Neck Carotid Arteries- Normal color. Moisture- Normal Moisture. No carotid bruits. No JVD.  Chest and Lung Exam Auscultation: Breath Sounds:-Normal.  Cardiovascular Auscultation:Rythm- Regular. Murmurs & Other Heart Sounds:Auscultation of the heart reveals- No Murmurs.  Abdomen Inspection:-Inspeection Normal. Palpation/Percussion:Note:No mass. Palpation and Percussion of the abdomen reveal- Non Tender, Non Distended + BS, no rebound or guarding.    Neurologic Cranial Nerve exam:- CN III-XII intact(No nystagmus), symmetric smile. Drift Test:- No drift. Romberg Exam:- Negative.  Heal to Toe Gait exam:-Normal. Finger to Nose:- Normal/Intact Strength:- 5/5 equal and symmetric strength both upper and lower extremities.   Lower ext- calfs symmetric no pedal edema.    Assessment & Plan:   Patient Instructions  1. Hyperkalemia Repeat level today - Comp Met (CMET)  2. Anxiety Continue xanax. With increased stress taking tablets more regularly. May need more tables and modify contract.   3. Hypertension, unspecified type Bp is still high. Unfortunate reported felt bad on high dose hydrlazine. So will cut you back to 75 mg tid, continue valsartan and continue nebivolol.   4. Insomnia, unspecified type Trazadone rx.    5. No pedal edema today. Lab and xray was stable. I think more dependant edema. Lasix  available for days if need severe edema. Also can elevated legs and use compression socks.  Follow up  2-3 weeks. Ask you send me my chart bp update in one week.   Esperanza Richters, PA-C

## 2022-11-22 NOTE — Patient Instructions (Addendum)
1. Hyperkalemia Repeat level today - Comp Met (CMET)  2. Anxiety Continue xanax. With increased stress taking tablets more regularly. May need more tables and modify contract.   3. Hypertension, unspecified type Bp is still high. Unfortunate reported felt bad on high dose hydrlazine. So will cut you back to 75 mg tid, continue valsartan and continue nebivolol.   4. Insomnia, unspecified type Trazadone rx.    5. No pedal edema today. Lab and xray was stable. I think more dependant edema. Lasix available for days if need severe edema. Also can elevated legs and use compression socks.  Follow up  2-3 weeks. Ask you send me my chart bp update in one week.

## 2022-11-29 MED ORDER — HYDRALAZINE HCL 50 MG PO TABS: 50.0000 mg | ORAL_TABLET | Freq: Three times a day (TID) | ORAL | 0 refills | Status: DC

## 2022-11-29 NOTE — Addendum Note (Signed)
Addended by: Gwenevere Abbot on: 11/29/2022 08:36 AM   Modules accepted: Orders

## 2022-12-09 ENCOUNTER — Other Ambulatory Visit: Payer: Self-pay | Admitting: Medical

## 2022-12-22 ENCOUNTER — Other Ambulatory Visit: Payer: Self-pay | Admitting: Medical

## 2022-12-26 MED ORDER — NEBIVOLOL HCL 20 MG PO TABS: 2.0000 | ORAL_TABLET | Freq: Every day | ORAL | 3 refills | Status: DC

## 2022-12-26 NOTE — Addendum Note (Signed)
Addended by: Gwenevere Abbot on: 12/26/2022 12:36 PM   Modules accepted: Orders

## 2023-01-02 ENCOUNTER — Ambulatory Visit (INDEPENDENT_AMBULATORY_CARE_PROVIDER_SITE_OTHER): Payer: PRIVATE HEALTH INSURANCE | Admitting: Medical

## 2023-01-02 ENCOUNTER — Encounter: Payer: Self-pay | Admitting: Medical

## 2023-01-02 VITALS — BP 158/88 | HR 86 | Temp 98.0°F | Resp 18 | Ht 67.0 in | Wt 235.0 lb

## 2023-01-02 DIAGNOSIS — K219 Gastro-esophageal reflux disease without esophagitis: Secondary | ICD-10-CM

## 2023-01-02 DIAGNOSIS — F419 Anxiety disorder, unspecified: Secondary | ICD-10-CM

## 2023-01-02 DIAGNOSIS — I10 Essential (primary) hypertension: Secondary | ICD-10-CM

## 2023-01-02 LAB — CBC WITH DIFFERENTIAL/PLATELET
Basophils Absolute: 0 10*3/uL (ref 0.0–0.1)
Basophils Relative: 0.5 % (ref 0.0–3.0)
Eosinophils Absolute: 0.1 10*3/uL (ref 0.0–0.7)
Eosinophils Relative: 2 % (ref 0.0–5.0)
HCT: 43.3 % (ref 36.0–46.0)
Hemoglobin: 14.2 g/dL (ref 12.0–15.0)
Lymphocytes Relative: 18 % (ref 12.0–46.0)
Lymphs Abs: 1.4 10*3/uL (ref 0.7–4.0)
MCHC: 32.8 g/dL (ref 30.0–36.0)
MCV: 100.3 fl — ABNORMAL HIGH (ref 78.0–100.0)
Monocytes Absolute: 0.7 10*3/uL (ref 0.1–1.0)
Monocytes Relative: 9.9 % (ref 3.0–12.0)
Neutro Abs: 5.2 10*3/uL (ref 1.4–7.7)
Neutrophils Relative %: 69.6 % (ref 43.0–77.0)
Platelets: 261 10*3/uL (ref 150.0–400.0)
RBC: 4.32 Mil/uL (ref 3.87–5.11)
RDW: 14.3 % (ref 11.5–15.5)
WBC: 7.5 10*3/uL (ref 4.0–10.5)

## 2023-01-02 LAB — COMPREHENSIVE METABOLIC PANEL
ALT: 8 U/L (ref 0–35)
AST: 15 U/L (ref 0–37)
Albumin: 3.8 g/dL (ref 3.5–5.2)
Alkaline Phosphatase: 65 U/L (ref 39–117)
BUN: 20 mg/dL (ref 6–23)
CO2: 27 mEq/L (ref 19–32)
Calcium: 9 mg/dL (ref 8.4–10.5)
Chloride: 104 mEq/L (ref 96–112)
Creatinine, Ser: 1.16 mg/dL (ref 0.40–1.20)
GFR: 51.61 mL/min — ABNORMAL LOW (ref >60.00)
Glucose, Bld: 104 mg/dL — ABNORMAL HIGH (ref 70–99)
Potassium: 5.1 mEq/L (ref 3.5–5.1)
Sodium: 138 mEq/L (ref 135–145)
Total Bilirubin: 0.6 mg/dL (ref 0.2–1.2)
Total Protein: 6.6 g/dL (ref 6.0–8.3)

## 2023-01-02 LAB — LIPASE: Lipase: 53 U/L (ref 11.0–59.0)

## 2023-01-02 MED ORDER — NEBIVOLOL HCL 20 MG PO TABS: 2.0000 | ORAL_TABLET | Freq: Every day | ORAL | 3 refills | Status: DC

## 2023-01-02 MED ORDER — FAMOTIDINE 20 MG PO TABS: 20.0000 mg | ORAL_TABLET | Freq: Every day | ORAL | 0 refills | Status: AC

## 2023-01-02 NOTE — Patient Instructions (Addendum)
Gerd- severe reflux last week but has improved since stopping caffeine and chocalate. Can increase omeprazole to 40 mg daily. Use your 20 mg otc tabs. Can add on famotadine 20 mg if needed. If using both and reflux returns as before then would get you back in with GI Md.  Today get cmp, cbc and lipase.(Want to to these labs since your recent flare caused some vomtiing). Now no vomiting for 5 days.  Htn- nebvicolol 20 mg twice daily. Valsartan 320 mg daily. Hydralazine 50 mg threed times daily. Bp high since out of nebvicolol for 5 days. Refilling that today. If bp is running over 140/90 at home on current regimen then I think next step is clonidione 0.1 mg twice daily.  For anxiety- let me know when you run out of xanax. Overall sparing use.  Ask you update me in one week  how you are doing with abdomen and blood pressure. Follow up date to be determined.

## 2023-01-02 NOTE — Progress Notes (Signed)
Subjective:    Patient ID: [name], female    DOB: 03-03-1961, 62 y.o.   MRN: [id number]  HPI Pt in states she had 3 days of severe reflux past week. She states she eliminated caffeine and chocalate and found it helped a lot. The day symptoms flared up had chicken tenders.   Pt is on omeprazole 20 mg daily. When she had flare she took tums and took extra omeprazole.  Pt has hx of egd and known hiatal hernia. 09-2022.  Bp is high but she has been out of nebvicolol 20 mg twice daily for 5 days. Valsartan 320 mg daily. Hydralazine 50 mg tid. Pt states that when took hydralazine higher dose felt hung over.   Pt states at one pont took 40 mg bid of nebvicolol. Her bp was 135/73.  Pt could not tolerate amlodipine due to pedal edema.       Review of Systems  Constitutional:  Negative for chills, fatigue and fever.  HENT:  Negative for congestion, drooling and ear discharge.   Respiratory:  Negative for cough, chest tightness, shortness of breath and wheezing.   Cardiovascular:  Negative for chest pain and palpitations.  Gastrointestinal:  Positive for abdominal pain. Negative for abdominal distention, blood in stool, diarrhea and nausea.  Genitourinary:  Negative for dyspareunia, enuresis, flank pain and genital sores.  Musculoskeletal:  Negative for back pain, joint swelling and neck pain.  Skin:  Negative for rash.  Neurological:  Negative for dizziness, tremors, speech difficulty, numbness and headaches.  Hematological:  Negative for adenopathy. Does not bruise/bleed easily.  Psychiatric/Behavioral:  Negative for behavioral problems and decreased concentration.     Past Medical History:  Diagnosis Date   Anxiety    GAD   Arthritis    Chronic kidney disease    lesion on left adrenal gland   Depression    in late 90s   Encounter for routine gynecological examination    Dr. Henderson Cloud   Endometriosis    Dr. Huntley Dec   Genital herpes    no recent outbreaks, no med   GERD  (gastroesophageal reflux disease)    Sullivan Lone disease    patient denies    H/O echocardiogram 06/14/2006   LV EF 55-60%, left atrium midly dilated, othewrise normal   History of constipation    prior with bread/grains, uses miralax prn with good results   History of EKG 07/19/2010   normal EKG   Hypertension    Migraine    Obesity    Osteoporosis 2019   eval and treatment through gyn, Raloxifene   Routine gynecological examination    sees gynecology yearly   Smoker    Traumatic arthritis of ankle    right -   Wears glasses      Social History   Socioeconomic History   Marital status: Married    Spouse name: Not on file   Number of children: Not on file   Years of education: Not on file   Highest education level: Not on file  Occupational History   Not on file  Tobacco Use   Smoking status: Every Day    Packs/day: .75    Types: Cigarettes   Smokeless tobacco: Never  Vaping Use   Vaping Use: Never used  Substance and Sexual Activity   Alcohol use: Yes    Alcohol/week: 21.0 standard drinks of alcohol    Types: 21 Shots of liquor per week   Drug use: No   Sexual activity: Yes  Birth control/protection: Post-menopausal  Other Topics Concern   Not on file  Social History Narrative   Married again 12/2017, prior separated.   Walks an hour daily for exercise.   Works at [employer]   Social Determinants of Corporate investment banker Strain: Not on [employer] Insecurity: Not on file  Transportation Needs: Not on file  Physical Activity: Not on file  Stress: Not on file  Social Connections: Not on file  Intimate Partner Violence: Not on file    Past Surgical History:  Procedure Laterality Date   ANKLE SURGERY  2010   right, ORIF, s/p trauma   CESAREAN SECTION     x 1   CHALAZION EXCISION     COLONOSCOPY  11/16   2 polyps, mild diverticulosis, repeat in 10 years; Dr. Stan Head   HYSTEROSCOPY     endometriosis   left breast surgery     removed  benign cyst   WISDOM TOOTH EXTRACTION      Family History  Problem Relation Age of Onset   Cancer Mother        skin   Heart disease Mother    Hypertension Mother    Hypertension Father    Cancer Father        mouth cancer   Cancer Brother 67       colon   Colon cancer Brother 70   Cancer Paternal Aunt    Diabetes Paternal Grandmother    Stroke Paternal Grandfather    Diabetes Paternal Grandfather    Colon polyps Neg Hx    Esophageal cancer Neg Hx    Rectal cancer Neg Hx    Stomach cancer Neg Hx    Breast cancer Neg Hx     Allergies  Allergen Reactions   Ace Inhibitors Cough   Amlodipine Swelling   Hydrochlorothiazide Other (See Comments)    Felt "bad"   Lisinopril Cough   Other     Eye dilation dye    Current Outpatient Medications on File Prior to Visit  Medication Sig Dispense Refill   ALPRAZolam (XANAX) 0.5 MG tablet Take by mouth.     ALPRAZolam (XANAX) 0.5 MG tablet Take 1 tablet (0.5 mg total) by mouth 2 (two) times daily as needed for anxiety. 15 tablet 0   aspirin 81 MG tablet Take 1 tablet (81 mg total) by mouth daily. 90 tablet 3   Cholecalciferol (CVS VIT D 5000 HIGH-POTENCY PO) Take 5,000 Units by mouth daily.     ciprofloxacin (CIPRO) 500 MG tablet Take 1 tablet (500 mg total) by mouth 2 (two) times daily. 14 tablet 0   dicyclomine (BENTYL) 10 MG capsule Take 1 capsule (10 mg total) by mouth daily with breakfast. 90 capsule 0   furosemide (LASIX) 20 MG tablet 1 tab po Monday, wed and friday 12 tablet 0   hydrALAZINE (APRESOLINE) 50 MG tablet TAKE 1 TABLET BY MOUTH THREE TIMES DAILY 90 tablet 0   Nebivolol HCl 20 MG TABS Take 2 tablets (40 mg total) by mouth daily. 180 tablet 3   omeprazole (PRILOSEC) 20 MG capsule Take 1 capsule (20 mg total) by mouth 2 (two) times daily before a meal. 60 capsule 2   raloxifene (EVISTA) 60 MG tablet Take 60 mg by mouth daily.     traZODone (DESYREL) 50 MG tablet Take 0.5-1 tablets (25-50 mg total) by mouth at bedtime  as needed for sleep. 30 tablet 3   UNABLE TO FIND Med Name: ultra  flora     UNABLE TO FIND Take 55 mg by mouth daily. Med Name: ba;ance probiotic     valsartan (DIOVAN) 320 MG tablet Take 1 tablet (320 mg total) by mouth daily. 90 tablet 3   No current facility-administered medications on file prior to visit.    BP (!) 158/88   Pulse 86   Temp 98 F (36.7 C)   Resp 18   Ht 5\' 7"  (1.702 m)   Wt 235 lb (106.6 kg)   LMP 06/07/2011   SpO2 99%   BMI 36.81 kg/m    .        Objective:   Physical Exam   General Mental Status- Alert. General Appearance- Not in acute distress.   Skin General: Color- Normal Color. Moisture- Normal Moisture.  Neck Carotid Arteries- Normal color. Moisture- Normal Moisture. No carotid bruits. No JVD.  Chest and Lung Exam Auscultation: Breath Sounds:-Normal.  Cardiovascular Auscultation:Rythm- Regular. Murmurs & Other Heart Sounds:Auscultation of the heart reveals- No Murmurs.  Abdomen Inspection:-Inspeection Normal. Palpation/Percussion:Note:No mass. Palpation and Percussion of the abdomen reveal- Non Tender, Non Distended + BS, no rebound or guarding.   Neurologic Cranial Nerve exam:- CN III-XII intact(No nystagmus), symmetric smile. Strength:- 5/5 equal and symmetric strength both upper and lower extremities.      Assessment & Plan:   Patient Instructions  Genella Rife- severe reflux last week but has improved since stopping caffeine and chocalate. Can increase omeprazole to 40 mg daily. Use your 20 mg otc tabs. Can add on famotadine 20 mg if needed. If using both and reflux returns as before then would get you back in with GI Md.  Today get cmp, cbc and lipase.(Want to to these labs since your recent flare caused some vomtiing). Now no vomiting for 5 days.  Htn- nebvicolol 20 mg twice daily. Valsartan 320 mg daily. Hydralazine 50 mg threed times daily. Bp high since out of nebvicolol for 5 days. Refilling that today. If bp is running over  140/90 at home on current regimen then I think next step is clonidione 0.1 mg twice daily.  For anxiety- let me know when you run out of xanax. Overall sparing use.  Ask you update me in one week  how you are doing with abdomen and blood pressure. Follow up date to be determined.     Esperanza Richters, PA-C

## 2023-01-02 NOTE — Addendum Note (Signed)
Addended by: Gwenevere Abbot on: 01/02/2023 11:20 AM   Modules accepted: Orders

## 2023-01-25 ENCOUNTER — Other Ambulatory Visit: Payer: Self-pay | Admitting: Medical

## 2023-01-25 LAB — HM MAMMOGRAPHY

## 2023-01-25 LAB — HM DEXA SCAN

## 2023-01-26 LAB — HM PAP SMEAR

## 2023-01-30 ENCOUNTER — Encounter: Payer: Self-pay | Admitting: Medical

## 2023-01-30 MED ORDER — HYDRALAZINE HCL 50 MG PO TABS: 50.0000 mg | ORAL_TABLET | Freq: Three times a day (TID) | ORAL | 5 refills | Status: DC

## 2023-01-30 MED ORDER — ALPRAZOLAM 0.5 MG PO TABS: 0.5000 mg | ORAL_TABLET | Freq: Every evening | ORAL | 0 refills | Status: DC | PRN

## 2023-01-30 NOTE — Addendum Note (Signed)
Addended by: Gwenevere Abbot on: 01/30/2023 05:39 PM   Modules accepted: Orders

## 2023-03-26 IMAGING — MG MM DIGITAL DIAGNOSTIC UNILAT*L* W/ TOMO W/ CAD
6 series · 6 of 18 positions shown · non-contrast
Comparison: Previous exam(s).

ACR Breast Density Category a: The breast tissue is almost entirely
fatty.

CLINICAL DATA: Patient returns after screening study for evaluation
of possible LEFT breast distortion. History of benign excisional
biopsy from the LEFT breast many years ago.

EXAM:
DIGITAL DIAGNOSTIC UNILATERAL LEFT MAMMOGRAM WITH TOMOSYNTHESIS AND
CAD
TECHNIQUE: Left digital diagnostic mammography and breast tomosynthesis was
performed. The images were evaluated with computer-aided detection.

[L CC synth-2D]
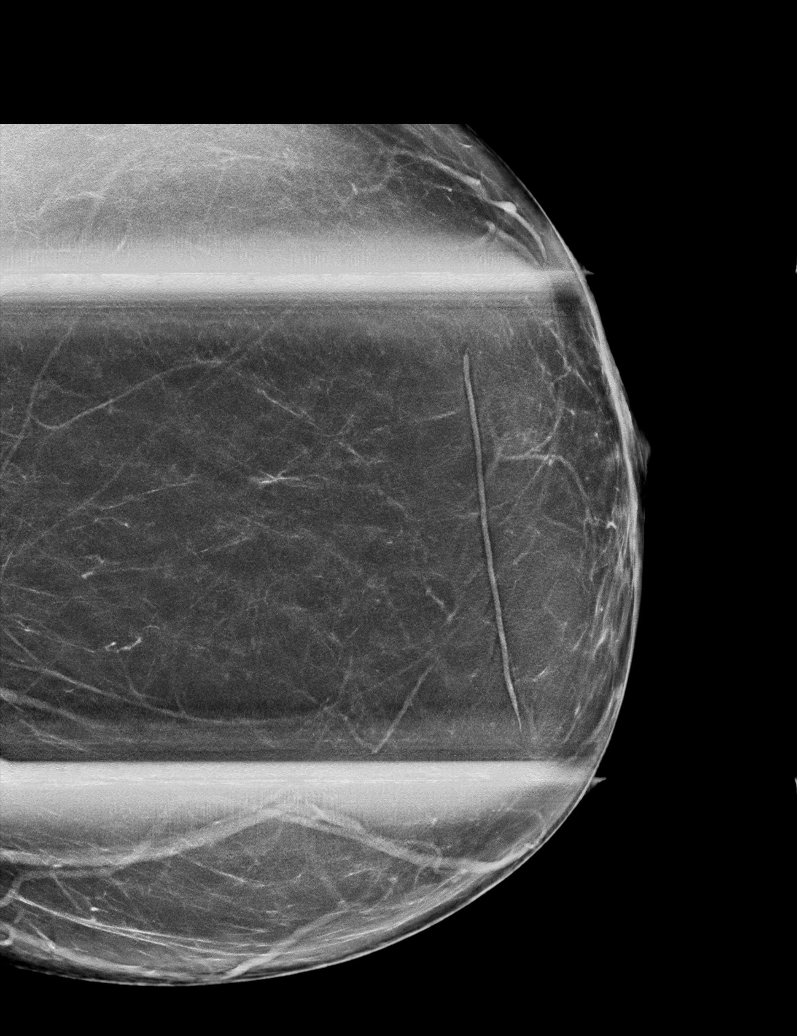

[L MLO synth-2D (1 of 2)]
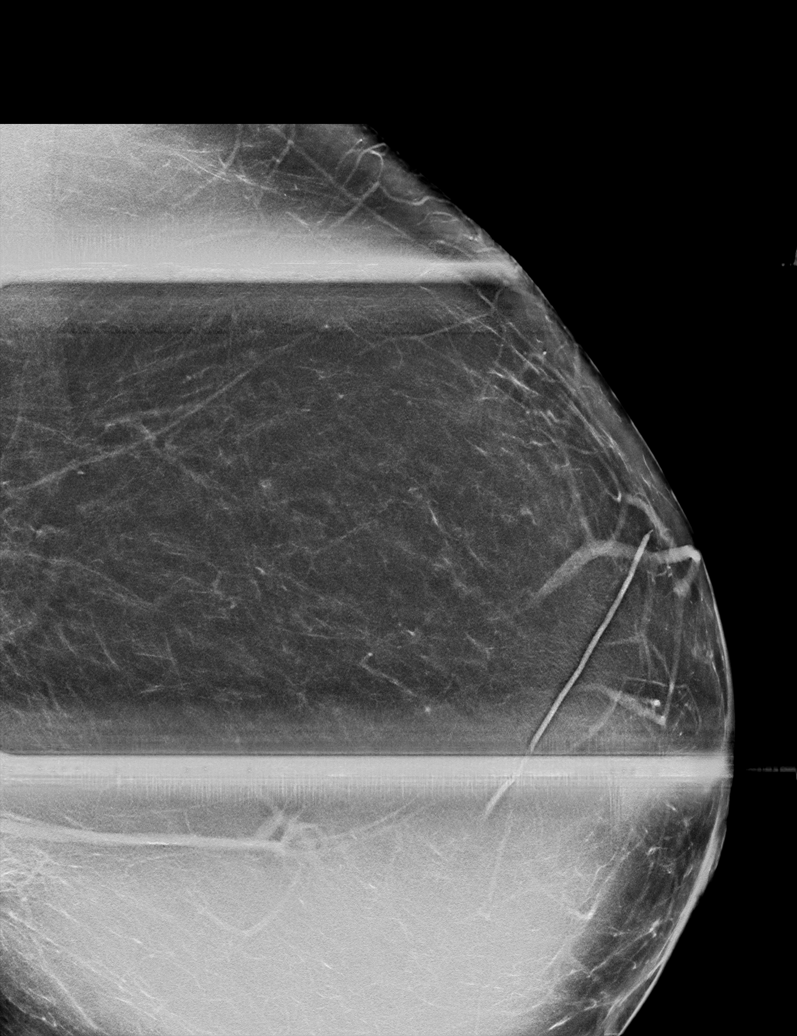

[L MLO synth-2D (2 of 2)]
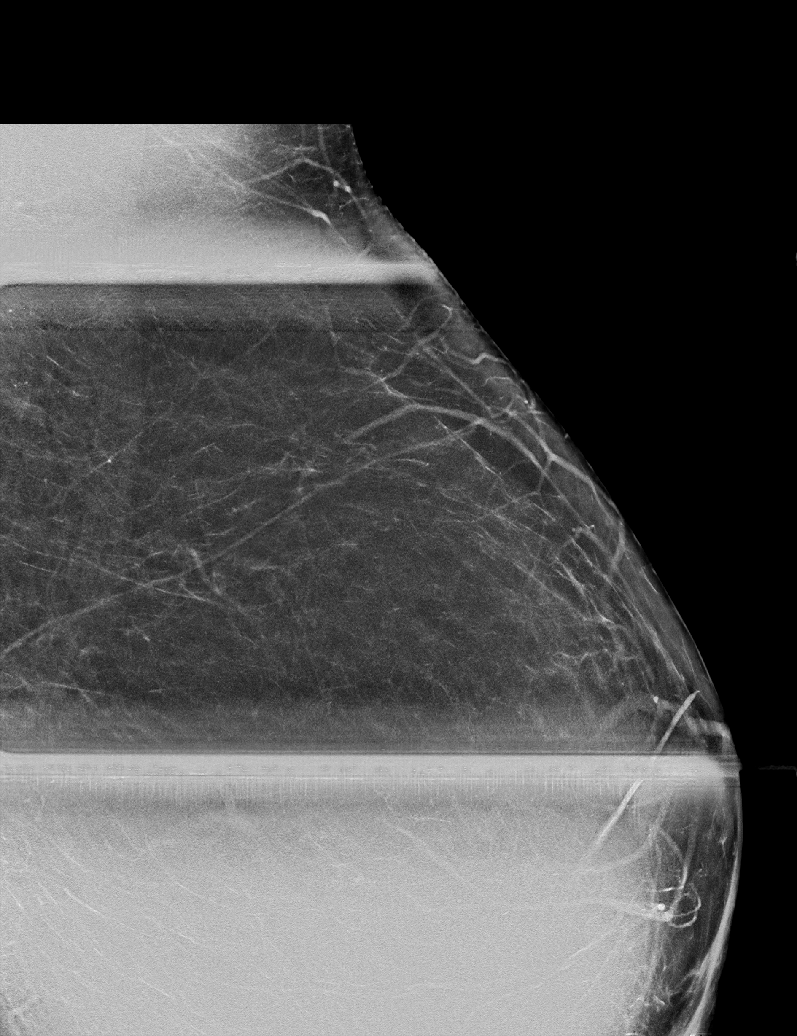

[L MLO tomo (1 of 2) · tomo slice 33/65.0]
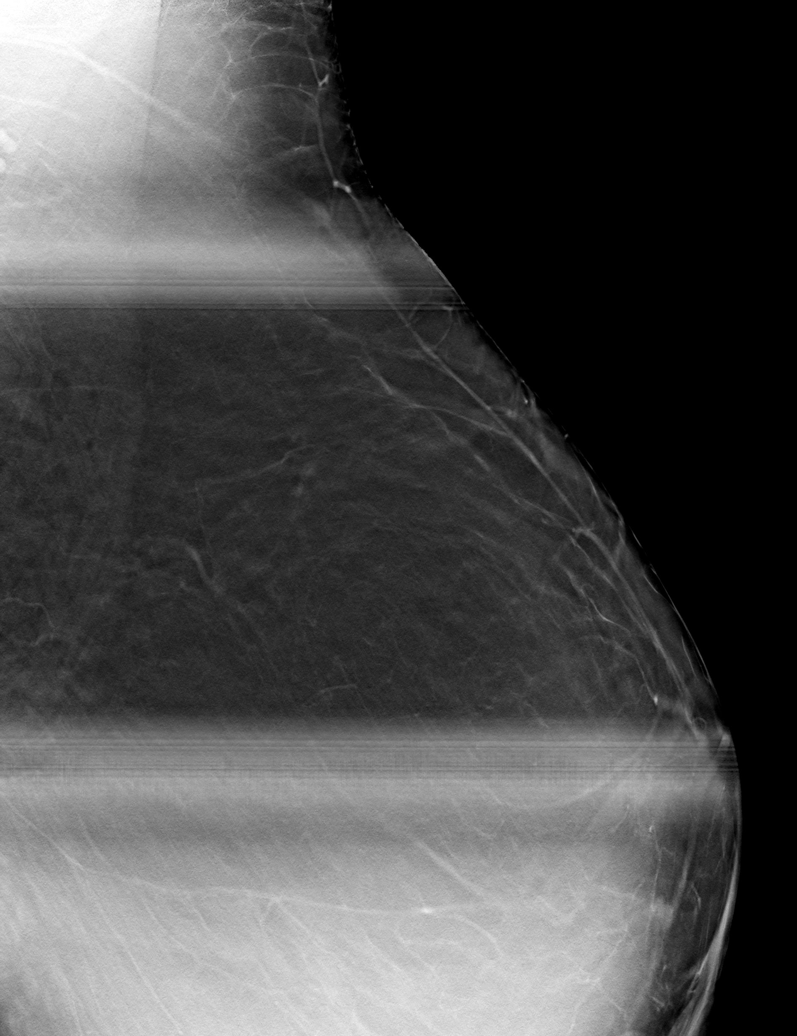

[L CC tomo · tomo slice 33/64.0]
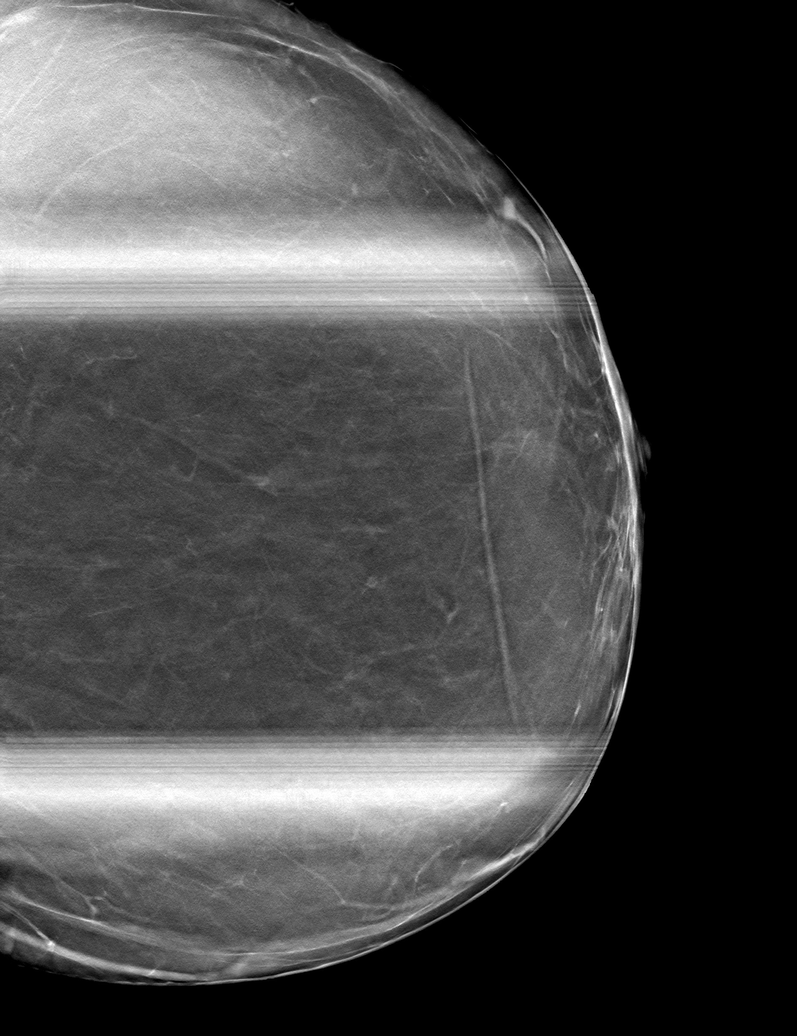

[L MLO tomo (2 of 2) · tomo slice 35/68.0]
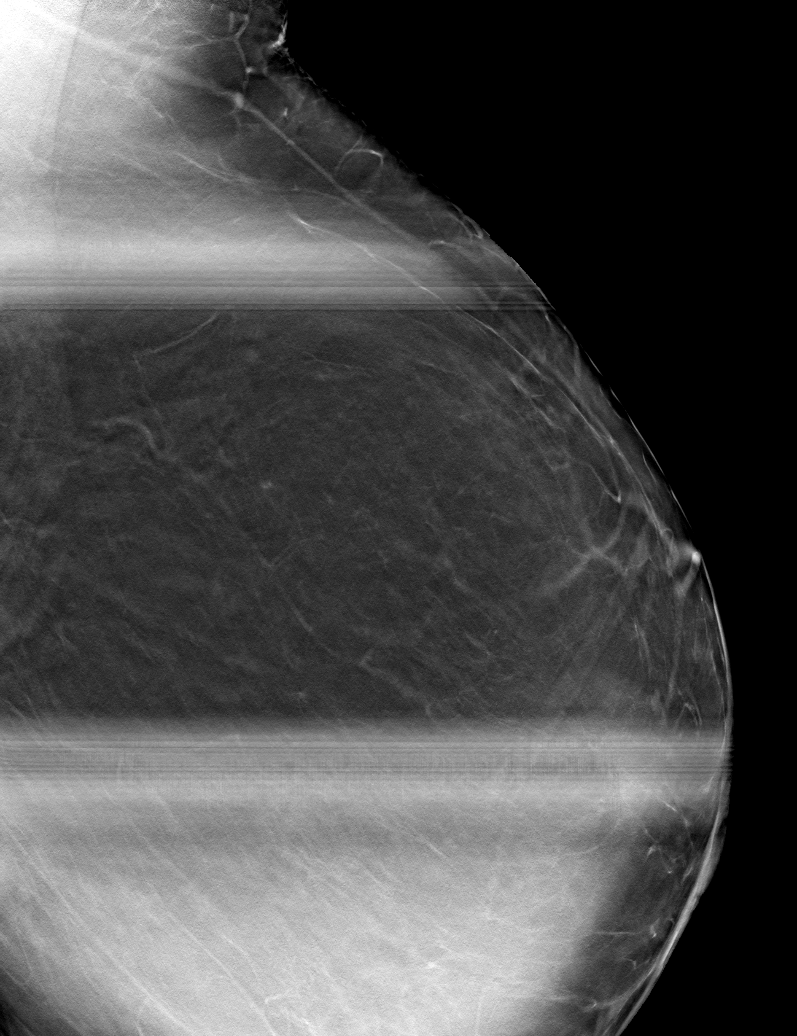

[6 of 18 positions shown; findings below may reference images not displayed]

FINDINGS: Additional 2-D and 3-D images are performed. These views show
postoperative changes in the UPPER central LEFT breast correlating
well with the marked surgical scar. No non surgical distortion is
present.
IMPRESSION: No mammographic evidence for malignancy.

RECOMMENDATION:
Screening mammogram in one year.(Code:6G-[id number])

I have discussed the findings and recommendations with the patient.
If applicable, a reminder letter will be sent to the patient
regarding the next appointment.

BI-RADS CATEGORY  1: Negative.

## 2023-04-12 LAB — COMPREHENSIVE METABOLIC PANEL: EGFR: 48

## 2023-04-23 NOTE — Progress Notes (Unsigned)
   Rubin Payor, PhD, LAT, ATC acting as a scribe for Clementeen Graham, MD.  [name] is a 62 y.o. female who presents to [facility] at [facility] today for osteoporosis management.   DEXA scan (date, T-score): 01/25/23 AP Spine= -2.3, R-FN= -2.3, L-FN= -2.5 Prior treatment: raloxifene 60mg  History of Hip, Spine, or Wrist Fx: R wrist 2023,  Heart disease or stroke: no Cancer:  none Kidney Disease: no Gastric/Peptic Ulcer: no Gastric bypass surgery: no Severe GERD: hx of diverticulitis, gastritis Hx of seizures: no Age at Menopause: 50 Calcium intake: yes 5000IU Vitamin D intake: no Hormone replacement therapy: no Smoking history: yes- current everyday smoker, 1 pack/day Alcohol: yes- 3-4 drinks Exercise: no Major dental work in past year: no Parents with hip/spine fracture: yes- mom spine fx Height loss: no   Pertinent review of systems: No fevers or chills  Relevant historical information: Active smoker.  Occasional exercise   Exam:  BP (!) 172/100   Pulse 83   Ht 5\' 7"  (1.702 m)   Wt 236 lb (107 kg)   LMP 06/07/2011   SpO2 99%   BMI 36.96 kg/m  General: Well Developed, well nourished, and in no acute distress.   MSK: Normal gait    Lab and Radiology Results  Recent chemistry from OB/GYN office.  Date of service was September 5.  Creatinine 1.28 with GFR 48.  Calcium normal.  Liver enzymes normal.  Electrolytes normal with the exception of glucose which was mildly elevated at 109.     Assessment and Plan: 62 y.o. female with osteoporosis with a history of a fracture.  Her wrist fracture in the last year or 2 could be a fragility fracture.  She currently is managed with raloxifene for the last few years.  She is tolerating it well and it seems to be working pretty well.  We could augment with Prolia.  Her renal function is appropriate for Prolia.  We should probably avoid bisphosphonates with renal function GFR 48.  I did find 1 study  supporting the use of Prolia and raloxifene at the same time.  The study I would say is relatively poor quality with a total number participants in the 60s but it seemed like it works a little better than either agent alone. http://www.deleon.com/   I think it is reasonable to continue raloxifene and add Prolia.  Will work on authorization of this medication.  Continue vitamin D and weightbearing exercise.   PDMP not reviewed this encounter. No orders of the defined types were placed in this encounter.  No orders of the defined types were placed in this encounter.    Discussed warning signs or symptoms. Please see discharge instructions. Patient expresses understanding.   The above documentation has been reviewed and is accurate and complete Clementeen Graham, M.D. Total encounter time 30 minutes including face-to-face time with the patient and, reviewing past medical record, and charting on the date of service.    CC: Dr Conley Rolls

## 2023-04-24 ENCOUNTER — Ambulatory Visit (INDEPENDENT_AMBULATORY_CARE_PROVIDER_SITE_OTHER): Payer: No Typology Code available for payment source | Admitting: Family Medicine

## 2023-04-24 VITALS — BP 172/100 | HR 83 | Ht 67.0 in | Wt 236.0 lb

## 2023-04-24 DIAGNOSIS — M810 Age-related osteoporosis without current pathological fracture: Secondary | ICD-10-CM

## 2023-04-24 NOTE — Patient Instructions (Signed)
Thank you for coming in today.

## 2023-04-30 ENCOUNTER — Telehealth: Payer: Self-pay

## 2023-04-30 DIAGNOSIS — Z87310 Personal history of (healed) osteoporosis fracture: Secondary | ICD-10-CM

## 2023-04-30 DIAGNOSIS — M810 Age-related osteoporosis without current pathological fracture: Secondary | ICD-10-CM

## 2023-04-30 NOTE — Telephone Encounter (Signed)
-----   Message from Adron Bene sent at 04/24/2023  9:04 AM EDT ----- Regarding: Prolia Please authorize Prolia  GFR = 46

## 2023-04-30 NOTE — Telephone Encounter (Signed)
Prolia VOB initiated via [email or web address]  Next Prolia inj DUE: new start

## 2023-05-01 ENCOUNTER — Encounter: Payer: Self-pay | Admitting: Medical

## 2023-05-01 ENCOUNTER — Telehealth: Payer: Self-pay

## 2023-05-01 MED ORDER — HYDRALAZINE HCL 50 MG PO TABS: 50.0000 mg | ORAL_TABLET | Freq: Three times a day (TID) | ORAL | 1 refills | Status: DC

## 2023-05-01 NOTE — Telephone Encounter (Signed)
Received call from [facility]- they wanted to verify Pt's hydralazine script if she should be on 50mg  or 100mg . Informed per our med list Pt should be on 50mg  tid.

## 2023-05-05 ENCOUNTER — Other Ambulatory Visit: Payer: Self-pay | Admitting: Physician Assistant

## 2023-05-07 NOTE — Telephone Encounter (Signed)
Pending

## 2023-05-08 NOTE — Telephone Encounter (Signed)
[employer] Copay enrollment complete Co-Pay Member ID [id number]

## 2023-05-08 NOTE — Telephone Encounter (Signed)
Pt ready for scheduling on or after   Out-of-pocket cost due at time of visit: $481 $25 if using [employer] copay enrollment program  Primary: [facility] Choice POS Prolia co-insurance: 30% Admin fee co-insurance: 0%  Deductible: $0 of $2,000 met, must be met for coverage to apply  OOP MAX: $122 of $4,000 met, once met, co-insurance will no longer apply  Prior Auth NOT required  Secondary: N/A Prolia co-insurance:  Admin fee co-insurance:  Deductible:  Prior Auth:  PA# Valid:     ** This summary of benefits is an estimation of the patient's out-of-pocket cost. Exact cost may very based on individual plan coverage.

## 2023-05-08 NOTE — Telephone Encounter (Signed)
Pt ready for scheduling on or after 05/08/23   Out-of-pocket cost due at time of visit: $25 Using [employer] Copay Assistance Program   Primary: [facility] Choice POS Prolia co-insurance: 30% Admin fee co-insurance: 0%   Deductible: $0 of $2,000 met, must be met for coverage to apply   OOP MAX: $122 of $4,000 met, once met, co-insurance will no longer apply   Prior Auth NOT required   Secondary: N/A Prolia co-insurance:  Admin fee co-insurance:  Deductible:  Prior Auth:  PA# Valid:      ** This summary of benefits is an estimation of the patient's out-of-pocket cost. Exact cost may very based on individual plan coverage.

## 2023-05-08 NOTE — Telephone Encounter (Addendum)
Spoke with patient regarding Prolia benefits and coverage.   Pt is agreeable to enrollment in the [employer] Copay Assistance Program.

## 2023-05-08 NOTE — Telephone Encounter (Addendum)
NO Prior Auth required for PROLIA  COPAY ENROLLMENT

## 2023-05-09 NOTE — Telephone Encounter (Signed)
Scheduled for 10/10.

## 2023-05-17 ENCOUNTER — Ambulatory Visit: Payer: No Typology Code available for payment source

## 2023-05-17 DIAGNOSIS — M810 Age-related osteoporosis without current pathological fracture: Secondary | ICD-10-CM | POA: Diagnosis not present

## 2023-05-17 MED ORDER — DENOSUMAB 60 MG/ML ~~LOC~~ SOSY
60.0000 mg | PREFILLED_SYRINGE | SUBCUTANEOUS | Status: AC
  Administered 2023-05-17: 60 mg via SUBCUTANEOUS

## 2023-05-17 NOTE — Progress Notes (Signed)
Pt received Prolia inj, 1 mL, SQ, right upper arm, tolerated well.   Pt advised of possible post-injection side effects.

## 2023-05-18 NOTE — Telephone Encounter (Signed)
Last Prolia inj 05/17/23 Next Prolia inj due 11/16/23

## 2023-06-05 MED ORDER — DENOSUMAB 60 MG/ML ~~LOC~~ SOSY: 60.0000 mg | PREFILLED_SYRINGE | Freq: Once | SUBCUTANEOUS | Status: AC

## 2023-06-05 NOTE — Addendum Note (Signed)
Addended by: Dierdre Searles on: 06/05/2023 08:12 AM   Modules accepted: Orders

## 2023-06-08 ENCOUNTER — Ambulatory Visit: Payer: No Typology Code available for payment source | Admitting: Medical

## 2023-06-08 ENCOUNTER — Encounter: Payer: Self-pay | Admitting: Medical

## 2023-06-08 ENCOUNTER — Ambulatory Visit (HOSPITAL_BASED_OUTPATIENT_CLINIC_OR_DEPARTMENT_OTHER)
Admission: RE | Admit: 2023-06-08 | Discharge: 2023-06-08 | Disposition: A | Discharge status: Home / Self Care | Payer: No Typology Code available for payment source | Source: Ambulatory Visit | Attending: Medical | Admitting: Medical

## 2023-06-08 VITALS — BP 144/95 | HR 70 | Resp 18 | Ht 67.0 in | Wt 234.8 lb

## 2023-06-08 DIAGNOSIS — I10 Essential (primary) hypertension: Secondary | ICD-10-CM

## 2023-06-08 DIAGNOSIS — K219 Gastro-esophageal reflux disease without esophagitis: Secondary | ICD-10-CM

## 2023-06-08 DIAGNOSIS — F419 Anxiety disorder, unspecified: Secondary | ICD-10-CM

## 2023-06-08 DIAGNOSIS — M6740 Ganglion, unspecified site: Secondary | ICD-10-CM

## 2023-06-08 DIAGNOSIS — R600 Localized edema: Secondary | ICD-10-CM

## 2023-06-08 LAB — COMPREHENSIVE METABOLIC PANEL
ALT: 10 U/L (ref 0–35)
AST: 16 U/L (ref 0–37)
Albumin: 4 g/dL (ref 3.5–5.2)
Alkaline Phosphatase: 72 U/L (ref 39–117)
BUN: 15 mg/dL (ref 6–23)
CO2: 29 meq/L (ref 19–32)
Calcium: 8.7 mg/dL (ref 8.4–10.5)
Chloride: 102 meq/L (ref 96–112)
Creatinine, Ser: 1.06 mg/dL (ref 0.40–1.20)
GFR: 57.34 mL/min — ABNORMAL LOW (ref >60.00)
Glucose, Bld: 95 mg/dL (ref 70–99)
Potassium: 4.5 meq/L (ref 3.5–5.1)
Sodium: 141 meq/L (ref 135–145)
Total Bilirubin: 0.8 mg/dL (ref 0.2–1.2)
Total Protein: 6.9 g/dL (ref 6.0–8.3)

## 2023-06-08 LAB — BRAIN NATRIURETIC PEPTIDE: Pro B Natriuretic peptide (BNP): 329 pg/mL — ABNORMAL HIGH (ref 0.0–100.0)

## 2023-06-08 NOTE — Patient Instructions (Addendum)
1. Hypertension, unspecified type -check kidney function as you do have decreased gfr. - valsartan 320 mg daily, hydralazine 50 mg three times daily and nebivolol 20 mg 20 mg 2 tab daily. -check bp at home and update me today. You do have some white coat component and recenltly bp 130/88 when you check. Please recheck when you get home and confirm - Comp Met (CMET)  2. Pedal edema(likley dependant edema) -with kidney function better to handle by elevation and compression socks. But do want to recheck below to evaluation differential. Want to avoid diuretic - DG Chest 2 View; Future - B Nat Peptide  3. Ganglion cyst - Ambulatory referral to Hand Surgery  4. GERD without esophagitis  -recommend increase omeprazole to 20 mg(2 tab daily) -if not adequate then would add on famotadine.  Anxiety -continue rare/sporadic use xanax.  Discuss may require contract and uds in future but not presently.  Follow up 1 month or sooner if needed

## 2023-06-08 NOTE — Progress Notes (Signed)
Subjective:    Patient ID: [name], female    DOB: August 29, 1960, 62 y.o.   MRN: [id number]  HPI  Pt bp at home has been high 138/88 approximate most of time at home. Pt notes whenever goes to provider office bp is much higher such as today. No cardiac or neurologic signs/symptoms.  Pt is on valsartan 320 mg daily, hydralazine 50 mg three times daily and nebivolol 20 mg 20 mg 2 tab daily.  Pt states over past 2 month get bilateral pedal edema. This occurs daily. States when she wakes in morning calves are not swollen but by afternoon has edema from ankles to knees. No dyspnea on lying supine. Since April wt up about 5 lbs. Pt correlates with onset of prolia injection.   Saw small lump ventral aspect of wrist just yesterday.   Pt has rare anxiety and 15 tab xanax last up until now. Still has 6 tabs.      Review of Systems  Constitutional:  Negative for chills and fatigue.  Respiratory:  Negative for cough, chest tightness, shortness of breath and wheezing.   Cardiovascular:  Negative for chest pain and palpitations.  Gastrointestinal:  Negative for abdominal pain, constipation and diarrhea.  Genitourinary:  Negative for dysuria.  Musculoskeletal:        Pedal edema.  Skin:  Negative for rash.  Neurological:  Negative for dizziness, syncope and numbness.  Hematological:  Negative for adenopathy. Does not bruise/bleed easily.  Psychiatric/Behavioral:  Negative for behavioral problems and dysphoric mood.     Past Medical History:  Diagnosis Date   Anxiety    GAD   Arthritis    Chronic kidney disease    lesion on left adrenal gland   Depression    in late 90s   Encounter for routine gynecological examination    Dr. Henderson Cloud   Endometriosis    Dr. Huntley Dec   Genital herpes    no recent outbreaks, no med   GERD (gastroesophageal reflux disease)    Sullivan Lone disease    patient denies    H/O echocardiogram 06/14/2006   LV EF 55-60%, left atrium midly dilated, othewrise  normal   History of constipation    prior with bread/grains, uses miralax prn with good results   History of EKG 07/19/2010   normal EKG   Hypertension    Migraine    Obesity    Osteoporosis 2019   eval and treatment through gyn, Raloxifene   Routine gynecological examination    sees gynecology yearly   Smoker    Traumatic arthritis of ankle    right -   Wears glasses      Social History   Socioeconomic History   Marital status: Married    Spouse name: Not on file   Number of children: Not on file   Years of education: Not on file   Highest education level: Not on file  Occupational History   Not on file  Tobacco Use   Smoking status: Every Day    Current packs/day: 0.75    Types: Cigarettes   Smokeless tobacco: Never  Vaping Use   Vaping status: Never Used  Substance and Sexual Activity   Alcohol use: Yes    Alcohol/week: 21.0 standard drinks of alcohol    Types: 21 Shots of liquor per week   Drug use: No   Sexual activity: Yes    Birth control/protection: Post-menopausal  Other Topics Concern   Not on file  Social History Narrative  Married again 12/2017, prior separated.   Walks an hour daily for exercise.   Works at [employer]   Social Determinants of Corporate investment banker Strain: Not on [employer] Insecurity: Not on file  Transportation Needs: Not on file  Physical Activity: Not on file  Stress: Not on file  Social Connections: Not on file  Intimate Partner Violence: Not on file    Past Surgical History:  Procedure Laterality Date   ANKLE SURGERY  2010   right, ORIF, s/p trauma   CESAREAN SECTION     x 1   CHALAZION EXCISION     COLONOSCOPY  11/16   2 polyps, mild diverticulosis, repeat in 10 years; Dr. Stan Head   HYSTEROSCOPY     endometriosis   left breast surgery     removed benign cyst   WISDOM TOOTH EXTRACTION      Family History  Problem Relation Age of Onset   Cancer Mother        skin   Heart disease Mother     Hypertension Mother    Hypertension Father    Cancer Father        mouth cancer   Cancer Brother 48       colon   Colon cancer Brother 21   Cancer Paternal Aunt    Diabetes Paternal Grandmother    Stroke Paternal Grandfather    Diabetes Paternal Grandfather    Colon polyps Neg Hx    Esophageal cancer Neg Hx    Rectal cancer Neg Hx    Stomach cancer Neg Hx    Breast cancer Neg Hx     Allergies  Allergen Reactions   Ace Inhibitors Cough   Amlodipine Swelling   Hydrochlorothiazide Other (See Comments)    Felt "bad"   Lisinopril Cough   Other     Eye dilation dye    Current Outpatient Medications on File Prior to Visit  Medication Sig Dispense Refill   ALPRAZolam (XANAX) 0.5 MG tablet Take 1 tablet (0.5 mg total) by mouth at bedtime as needed for anxiety. 15 tablet 0   aspirin 81 MG tablet Take 1 tablet (81 mg total) by mouth daily. 90 tablet 3   Cholecalciferol ([employer] VIT D 5000 HIGH-POTENCY PO) Take 5,000 Units by mouth daily.     famotidine (PEPCID) 20 MG tablet Take 1 tablet (20 mg total) by mouth daily. 30 tablet 0   hydrALAZINE (APRESOLINE) 50 MG tablet Take 1 tablet (50 mg total) by mouth 3 (three) times daily. 90 tablet 1   Nebivolol HCl 20 MG TABS Take 2 tablets (40 mg total) by mouth daily. 180 tablet 3   omeprazole (PRILOSEC) 20 MG capsule TAKE 1 CAPSULE BY MOUTH TWICE DAILY BEFORE A MEAL 60 capsule 3   raloxifene (EVISTA) 60 MG tablet Take 60 mg by mouth daily.     traZODone (DESYREL) 50 MG tablet Take 0.5-1 tablets (25-50 mg total) by mouth at bedtime as needed for sleep. 30 tablet 3   UNABLE TO FIND Med Name: ultra flora     UNABLE TO FIND Take 55 mg by mouth daily. Med Name: ba;ance probiotic     valsartan (DIOVAN) 320 MG tablet Take 1 tablet (320 mg total) by mouth daily. 90 tablet 3   Current Facility-Administered Medications on File Prior to Visit  Medication Dose Route Frequency Provider Last Rate Last Admin   denosumab (PROLIA) injection 60 mg  60 mg  Subcutaneous Q6 months Clementeen Graham  S, MD   60 mg at 05/17/23 0812   [START ON 06/18/2023] denosumab (PROLIA) injection 60 mg  60 mg Subcutaneous Once Rodolph Bong, MD        BP (!) 160/98   Pulse 70   Resp 18   Ht 5\' 7"  (1.702 m)   Wt 234 lb 12.8 oz (106.5 kg)   LMP 06/07/2011   SpO2 98%   BMI 36.77 kg/m        Objective:   Physical Exam  General Mental Status- Alert. General Appearance- Not in acute distress.   Skin General: Color- Normal Color. Moisture- Normal Moisture.  Neck Carotid Arteries- Normal color. Moisture- Normal Moisture. No carotid bruits. No JVD.  Chest and Lung Exam Auscultation: Breath Sounds:-Normal.  Cardiovascular Auscultation:Rythm- Regular. Murmurs & Other Heart Sounds:Auscultation of the heart reveals- No Murmurs.  Abdomen Inspection:-Inspeection Normal. Palpation/Percussion:Note:No mass. Palpation and Percussion of the abdomen reveal- Non Tender, Non Distended + BS, no rebound or guarding.   Neurologic Cranial Nerve exam:- CN III-XII intact(No nystagmus), symmetric smile. Strength:- 5/5 equal and symmetric strength both upper and lower extremities.       Assessment & Plan:   Patient Instructions  1. Hypertension, unspecified type -check kidney function as you do have decreased gfr. - valsartan 320 mg daily, hydralazine 50 mg three times daily and nebivolol 20 mg 20 mg 2 tab daily. -check bp at home and update me today. You do have some white coat component and recenltly bp 130/88 when you check. Please recheck when you get home and confirm - Comp Met (CMET)  2. Pedal edema(likley dependant edema) -with kidney function better to handle by elevation and compression socks. But do want to recheck below to evaluation differential. Want to avoid diuretic - DG Chest 2 View; Future - B Nat Peptide  3. Ganglion cyst - Ambulatory referral to Hand Surgery  4. GERD without esophagitis  -recommend increase omeprazole to 20 mg(2 tab  daily) -if not adequate then would add on famotadine.  Follow up 1 month or sooner if needed   [employer], PA-C

## 2023-06-09 MED ORDER — FUROSEMIDE 20 MG PO TABS: 20.0000 mg | ORAL_TABLET | Freq: Every day | ORAL | 0 refills | Status: DC

## 2023-06-09 NOTE — Addendum Note (Signed)
Addended by: Gwenevere Abbot on: 06/09/2023 05:57 AM   Modules accepted: Orders

## 2023-06-20 ENCOUNTER — Encounter: Payer: Self-pay | Admitting: Medical

## 2023-06-20 MED ORDER — FUROSEMIDE 20 MG PO TABS: ORAL_TABLET | ORAL | 0 refills | Status: DC

## 2023-06-20 NOTE — Addendum Note (Signed)
Addended by: Gwenevere Abbot on: 06/20/2023 03:11 PM   Modules accepted: Orders

## 2023-07-03 ENCOUNTER — Ambulatory Visit: Payer: No Typology Code available for payment source | Admitting: Orthopaedic Surgery

## 2023-07-03 ENCOUNTER — Encounter: Payer: Self-pay | Admitting: Orthopaedic Surgery

## 2023-07-03 ENCOUNTER — Other Ambulatory Visit (INDEPENDENT_AMBULATORY_CARE_PROVIDER_SITE_OTHER): Payer: Self-pay

## 2023-07-03 DIAGNOSIS — M25571 Pain in right ankle and joints of right foot: Secondary | ICD-10-CM

## 2023-07-03 DIAGNOSIS — M6740 Ganglion, unspecified site: Secondary | ICD-10-CM | POA: Diagnosis not present

## 2023-07-03 NOTE — Progress Notes (Signed)
Office Visit Note   Patient: [name]           Date of Birth: 1960-12-02           MRN: [id number] Visit Date: 07/03/2023              Requested by: Esperanza Richters, PA-C [id number] [place],  [place] [address] PCP: Esperanza Richters, PA-C   Assessment & Plan: Visit Diagnoses:  1. Pain in right ankle and joints of right foot   2. Ganglion cyst     Plan: Discussed options regarding ganglion cyst with patient, at this time given that patient is asymptomatic, would continue to observe the cyst.  Cyst itself may resolve on its own and will only consider surgical options if it becomes more symptomatic for patient.  Patient's right ankle at this time shows no signs of any hardware issues or lucency.  Hardware appears to be intact.  There is no signs of any acute fracture or bony abnormality.  Patient likely dealing with some ankle instability and peroneal tendinitis at this time, patient would benefit from conservative therapy first with some topical Voltaren (patient has history of gastritis) as well as a compression sleeve.  Patient advised to try these therapies for the next 4 weeks as well as some rest and if no improvement approximately 4 weeks can follow-up with [place] for further management and formal PT.  Patient understanding and agreeable with plan.  Follow-Up Instructions: No follow-ups on file.   Orders:  Orders Placed This Encounter  Procedures   XR Ankle Complete Right   No orders of the defined types were placed in this encounter.     Procedures: No procedures performed   Clinical Data: No additional findings.   Subjective: Chief Complaint  Patient presents with   Right Wrist - Pain   Right Ankle - Pain   HPI   Patient is presenting for some right ankle pain has been going on for the past 3 days.  Patient had surgery many years ago and had screws placed in her lateral medial side of her right ankle.  Patient states that she has been having  pain in her heel and that sometimes radiates to the lateral side, patient is also having some pain in the anterior aspect of her ankle.  Patient has not been taking anything for the pain and has not tried any bracing.  Patient also has a right-sided volar ganglion cyst at the distal aspect of her radius.  Patient states that it is not painful or bothersome at this time.    Review of Systems  Constitutional: Negative.   HENT: Negative.    Eyes: Negative.   Respiratory: Negative.    Cardiovascular: Negative.   Endocrine: Negative.   Musculoskeletal:  Positive for arthralgias.  Neurological: Negative.   Hematological: Negative.   Psychiatric/Behavioral: Negative.    All other systems reviewed and are negative.    Objective: Vital Signs: LMP 06/07/2011   Physical Exam Vitals and nursing note reviewed.  Constitutional:      Appearance: She is well-developed.  HENT:     Head: Atraumatic.     Nose: Nose normal.  Eyes:     Extraocular Movements: Extraocular movements intact.  Cardiovascular:     Pulses: Normal pulses.  Pulmonary:     Effort: Pulmonary effort is normal.  Abdominal:     Palpations: Abdomen is soft.  Musculoskeletal:     Cervical back: Neck supple.  Skin:  General: Skin is warm.     Capillary Refill: Capillary refill takes less than 2 seconds.  Neurological:     Mental Status: She is alert. Mental status is at baseline.  Psychiatric:        Behavior: Behavior normal.        Thought Content: Thought content normal.        Judgment: Judgment normal.     Ortho Exam Right wrist: Inspection reveals a small ganglion cyst on the volar aspect of the right wrist at the distal radial area.  Cyst is compressible, nontender to palpation.  Patient has full range of motion of her wrist with flexion extension as well as radial and ulnar deviation.  Right ankle: Inspection reveals no significant swelling, erythema or bruising of the right ankle.  There is tenderness to  palpation over the Achilles tendon itself, its insertional over the calcaneus.  There is also tenderness palpation over the peroneals on the lateral aspect, mild tenderness to palpation over the deltoid ligament on the medial aspect.  There is some tenderness over the ATL left as well.  Range of motion is good with inversion, eversion, dorsiflexion plantarflexion.  Patient has good strength with plantarflexion and dorsiflexion. Specialty Comments:  No specialty comments available.  Imaging: XR Ankle Complete Right  Result Date: 07/03/2023 X-rays of the right ankle show prior ORIF of the bimalleolar ankle fracture with stable hardware.  No complications.  No significant degenerative or posttraumatic changes.    PMFS History: Patient Active Problem List   Diagnosis Date Noted   Screening for breast cancer 12/13/2018   Osteoporosis 12/13/2018   Acute low back pain 12/13/2018   Vitamin D deficiency 12/13/2018   Vaccine counseling 01/08/2017   GERD without esophagitis 01/08/2017   Family history colon cancer - brother at 71 dx 06/18/2015   Obesity 11/19/2014   Essential hypertension 11/19/2014   Anxiety state 11/19/2014   Past Medical History:  Diagnosis Date   Anxiety    GAD   Arthritis    Chronic kidney disease    lesion on left adrenal gland   Depression    in late 90s   Encounter for routine gynecological examination    Dr. Henderson Cloud   Endometriosis    Dr. Huntley Dec   Genital herpes    no recent outbreaks, no med   GERD (gastroesophageal reflux disease)    Sullivan Lone disease    patient denies    H/O echocardiogram 06/14/2006   LV EF 55-60%, left atrium midly dilated, othewrise normal   History of constipation    prior with bread/grains, uses miralax prn with good results   History of EKG 07/19/2010   normal EKG   Hypertension    Migraine    Obesity    Osteoporosis 2019   eval and treatment through gyn, Raloxifene   Routine gynecological examination    sees gynecology  yearly   Smoker    Traumatic arthritis of ankle    right -   Wears glasses     Family History  Problem Relation Age of Onset   Cancer Mother        skin   Heart disease Mother    Hypertension Mother    Hypertension Father    Cancer Father        mouth cancer   Cancer Brother 45       colon   Colon cancer Brother 81   Cancer Paternal Aunt    Diabetes Paternal Grandmother  Stroke Paternal Grandfather    Diabetes Paternal Grandfather    Colon polyps Neg Hx    Esophageal cancer Neg Hx    Rectal cancer Neg Hx    Stomach cancer Neg Hx    Breast cancer Neg Hx     Past Surgical History:  Procedure Laterality Date   ANKLE SURGERY  2010   right, ORIF, s/p trauma   CESAREAN SECTION     x 1   CHALAZION EXCISION     COLONOSCOPY  11/16   2 polyps, mild diverticulosis, repeat in 10 years; Dr. Stan Head   HYSTEROSCOPY     endometriosis   left breast surgery     removed benign cyst   WISDOM TOOTH EXTRACTION     Social History   Occupational History   Not on file  Tobacco Use   Smoking status: Every Day    Current packs/day: 0.75    Types: Cigarettes   Smokeless tobacco: Never  Vaping Use   Vaping status: Never Used  Substance and Sexual Activity   Alcohol use: Yes    Alcohol/week: 21.0 standard drinks of alcohol    Types: 21 Shots of liquor per week   Drug use: No   Sexual activity: Yes    Birth control/protection: Post-menopausal

## 2023-07-14 ENCOUNTER — Other Ambulatory Visit: Payer: Self-pay | Admitting: Medical

## 2023-08-30 ENCOUNTER — Other Ambulatory Visit: Payer: Self-pay | Admitting: Physician Assistant

## 2023-09-09 ENCOUNTER — Other Ambulatory Visit: Payer: Self-pay | Admitting: Medical

## 2023-09-24 ENCOUNTER — Other Ambulatory Visit (INDEPENDENT_AMBULATORY_CARE_PROVIDER_SITE_OTHER): Payer: No Typology Code available for payment source

## 2023-09-24 DIAGNOSIS — I10 Essential (primary) hypertension: Secondary | ICD-10-CM

## 2023-09-24 LAB — COMPREHENSIVE METABOLIC PANEL
ALT: 8 U/L (ref 0–35)
AST: 14 U/L (ref 0–37)
Albumin: 3.9 g/dL (ref 3.5–5.2)
Alkaline Phosphatase: 63 U/L (ref 39–117)
BUN: 17 mg/dL (ref 6–23)
CO2: 28 meq/L (ref 19–32)
Calcium: 8.9 mg/dL (ref 8.4–10.5)
Chloride: 104 meq/L (ref 96–112)
Creatinine, Ser: 1.04 mg/dL (ref 0.40–1.20)
GFR: 58.54 mL/min — ABNORMAL LOW (ref >60.00)
Glucose, Bld: 83 mg/dL (ref 70–99)
Potassium: 5 meq/L (ref 3.5–5.1)
Sodium: 140 meq/L (ref 135–145)
Total Bilirubin: 0.6 mg/dL (ref 0.2–1.2)
Total Protein: 6.7 g/dL (ref 6.0–8.3)

## 2023-09-26 ENCOUNTER — Other Ambulatory Visit: Payer: Self-pay | Admitting: Physician Assistant

## 2023-09-26 ENCOUNTER — Encounter: Payer: Self-pay | Admitting: Medical

## 2023-10-22 NOTE — Telephone Encounter (Signed)
 Prolia VOB initiated via [email or web address]  Next Prolia inj DUE: 11/16/23

## 2023-10-25 ENCOUNTER — Telehealth: Payer: Self-pay | Admitting: Family Medicine

## 2023-10-25 NOTE — Telephone Encounter (Signed)
 Pt scheduled Prolia 11/16/2023. Last 05/17/2023.  FYI for auth if needed  Also, pt received a call from [facility] today, states we referred her there for the Prolia? I think this referral may have been entered to [facility] in error?

## 2023-10-25 NOTE — Telephone Encounter (Signed)
 Sent to [facility] in error. Referral updated.

## 2023-10-26 ENCOUNTER — Other Ambulatory Visit: Payer: Self-pay | Admitting: Medical

## 2023-10-26 ENCOUNTER — Other Ambulatory Visit: Payer: Self-pay | Admitting: Physician Assistant

## 2023-10-31 NOTE — Telephone Encounter (Signed)
 [facility] [employer] [facility] [employer] - Prior Authorization REQUIRED for [employer]  PA PROCESS DETAILS: PA is required and is currently not on file. Please call Medical Review at [phone] to initiate PA.

## 2023-11-06 NOTE — Telephone Encounter (Signed)
 Prior Auth initiated for Prolia via [employer].

## 2023-11-08 ENCOUNTER — Other Ambulatory Visit: Payer: Self-pay | Admitting: Medical

## 2023-11-08 ENCOUNTER — Telehealth: Payer: Self-pay | Admitting: Family Medicine

## 2023-11-08 DIAGNOSIS — M810 Age-related osteoporosis without current pathological fracture: Secondary | ICD-10-CM

## 2023-11-08 MED ORDER — FUROSEMIDE 20 MG PO TABS: ORAL_TABLET | ORAL | 0 refills | Status: AC

## 2023-11-08 NOTE — Telephone Encounter (Signed)
 Pt ready for scheduling on or after 11/16/23  Out-of-pocket cost due at time of visit: $  Primary: [facility] [employer] POS II Prolia co-insurance: 0% Admin fee co-insurance: $25  Deductible: does not apply  Prior Auth: NOT required  Secondary: n/a Prolia co-insurance:  Admin fee co-insurance:  Deductible:  Prior Auth:  PA# Valid:     ** This summary of benefits is an estimation of the patient's out-of-pocket cost. Exact cost may very based on individual plan coverage.

## 2023-11-08 NOTE — Telephone Encounter (Signed)
 Patient called and is asking where she needs to give blood work so they know what test to run before her shot.

## 2023-11-08 NOTE — Telephone Encounter (Signed)
 Lets double check labs prior to prolia

## 2023-11-08 NOTE — Telephone Encounter (Signed)
 Dr. Denyse Amass, pt had CMP 09/24/23. I do not see other recent Prolia labs. Please advise if you would like pt to have labs before Prolia injection.

## 2023-11-08 NOTE — Telephone Encounter (Signed)
 Lab orders have been placed

## 2023-11-08 NOTE — Telephone Encounter (Signed)
 Prolia - Prior Auth NOT required

## 2023-11-08 NOTE — Addendum Note (Signed)
 Addended by: Dierdre Searles on: 11/08/2023 12:54 PM   Modules accepted: Orders

## 2023-11-09 ENCOUNTER — Encounter: Payer: Self-pay | Admitting: Medical

## 2023-11-09 ENCOUNTER — Ambulatory Visit (INDEPENDENT_AMBULATORY_CARE_PROVIDER_SITE_OTHER): Admitting: Medical

## 2023-11-09 ENCOUNTER — Telehealth: Payer: Self-pay

## 2023-11-09 ENCOUNTER — Encounter: Payer: Self-pay | Admitting: Family Medicine

## 2023-11-09 ENCOUNTER — Other Ambulatory Visit (INDEPENDENT_AMBULATORY_CARE_PROVIDER_SITE_OTHER)

## 2023-11-09 ENCOUNTER — Ambulatory Visit (HOSPITAL_BASED_OUTPATIENT_CLINIC_OR_DEPARTMENT_OTHER)
Admission: RE | Admit: 2023-11-09 | Discharge: 2023-11-09 | Disposition: A | Discharge status: Home / Self Care | Source: Ambulatory Visit | Attending: Medical | Admitting: Medical

## 2023-11-09 VITALS — BP 150/80 | HR 80 | Temp 98.1°F | Resp 18 | Ht 67.0 in | Wt 234.0 lb

## 2023-11-09 DIAGNOSIS — R062 Wheezing: Secondary | ICD-10-CM

## 2023-11-09 DIAGNOSIS — J301 Allergic rhinitis due to pollen: Secondary | ICD-10-CM | POA: Diagnosis not present

## 2023-11-09 DIAGNOSIS — M810 Age-related osteoporosis without current pathological fracture: Secondary | ICD-10-CM

## 2023-11-09 DIAGNOSIS — F419 Anxiety disorder, unspecified: Secondary | ICD-10-CM

## 2023-11-09 DIAGNOSIS — R059 Cough, unspecified: Secondary | ICD-10-CM | POA: Insufficient documentation

## 2023-11-09 DIAGNOSIS — J40 Bronchitis, not specified as acute or chronic: Secondary | ICD-10-CM | POA: Insufficient documentation

## 2023-11-09 LAB — COMPREHENSIVE METABOLIC PANEL WITH GFR
ALT: 12 U/L (ref 0–35)
AST: 22 U/L (ref 0–37)
Albumin: 4 g/dL (ref 3.5–5.2)
Alkaline Phosphatase: 53 U/L (ref 39–117)
BUN: 14 mg/dL (ref 6–23)
CO2: 26 meq/L (ref 19–32)
Calcium: 8.8 mg/dL (ref 8.4–10.5)
Chloride: 104 meq/L (ref 96–112)
Creatinine, Ser: 1.02 mg/dL (ref 0.40–1.20)
GFR: 59.87 mL/min — ABNORMAL LOW (ref >60.00)
Glucose, Bld: 89 mg/dL (ref 70–99)
Potassium: 4.2 meq/L (ref 3.5–5.1)
Sodium: 140 meq/L (ref 135–145)
Total Bilirubin: 0.4 mg/dL (ref 0.2–1.2)
Total Protein: 7 g/dL (ref 6.0–8.3)

## 2023-11-09 LAB — MAGNESIUM: Magnesium: 1.8 mg/dL (ref 1.5–2.5)

## 2023-11-09 LAB — PHOSPHORUS: Phosphorus: 2.9 mg/dL (ref 2.3–4.6)

## 2023-11-09 LAB — VITAMIN D 25 HYDROXY (VIT D DEFICIENCY, FRACTURES): VITD: 101.86 ng/mL (ref 30.00–100.00)

## 2023-11-09 MED ORDER — LEVOCETIRIZINE DIHYDROCHLORIDE 5 MG PO TABS: 5.0000 mg | ORAL_TABLET | Freq: Every evening | ORAL | 0 refills | Status: AC

## 2023-11-09 MED ORDER — BENZONATATE 100 MG PO CAPS: 100.0000 mg | ORAL_CAPSULE | Freq: Three times a day (TID) | ORAL | 0 refills | Status: AC | PRN

## 2023-11-09 MED ORDER — ALBUTEROL SULFATE HFA 108 (90 BASE) MCG/ACT IN AERS: 2.0000 | INHALATION_SPRAY | Freq: Four times a day (QID) | RESPIRATORY_TRACT | 0 refills | Status: AC | PRN

## 2023-11-09 MED ORDER — ALPRAZOLAM 0.5 MG PO TABS: ORAL_TABLET | ORAL | 0 refills | Status: AC

## 2023-11-09 MED ORDER — AZITHROMYCIN 250 MG PO TABS: ORAL_TABLET | ORAL | 0 refills | Status: AC

## 2023-11-09 MED ORDER — FLUTICASONE PROPIONATE 50 MCG/ACT NA SUSP: 2.0000 | Freq: Every day | NASAL | 1 refills | Status: AC

## 2023-11-09 NOTE — Telephone Encounter (Signed)
 Critical lab results for pt. Elevated Vit D levels at 104.86

## 2023-11-09 NOTE — Patient Instructions (Signed)
 Bronchitis preceded by 2 weeks of allergic rhinitis. - pneumonia to be ruled out. - Order chest x-ray to rule out pneumonia. - Prescribe azithromycin if chest x-ray is positive for pneumonia or if worse bronchitis like  symptoms - Prescribe benzonatate for cough, one tablet every eight hours as needed. - Provide albuterol inhaler for use if wheezing worsens. - Continue Mucinex for mucus thinning. - Prescribe Flonase nasal spray, two sprays each nostril once a day. - Prescribe Xyzal antihistamine to take at night.  Hypertension Elevated blood pressure at clinic attributed to stress; better controlled at home. - Monitor blood pressure at home over the weekend and report readings via MyChart. - Continue current antihypertensive regimen: hydralazine three times a day, valsartan in the morning, and nebivolol at night.  Peripheral Edema Persistent leg swelling with cautious diuretic use due to low GFR. - Use Lasix sparingly, once or twice a week, and monitor for kidney function. - Check kidney function more frequently if Lasix use increases.  Anxiety Anxiety exacerbated by mother's hospice situation; medication used sparingly. - Prescribe 15 tablets of anxiety medication to be used sparingly. -may need to initiate contract or uds if more frequent refills needed.  Follow up 14 days or sooner if needed

## 2023-11-09 NOTE — Progress Notes (Signed)
 Subjective:    Patient ID: Vista Deck, female    DOB: [birth year], 63 y.o.   MRN: [id number]  HPI  [name] is a 63 year old female with hypertension who presents with chest congestion and cough. She is accompanied by her husband.  She has been experiencing chest congestion and a persistent cough for the past week. The cough is severe, causing pain in her chest and back, and has been disruptive to her sleep and her husband's. She feels congested in the chest but is unable to expectorate mucus due to pain and lack of strength. Wheezing was noted for the first time on her way to the clinic. Her symptoms began  2 weeks ago with nasal congestion and runny, itchy, watery eyes, which she attributes to allergies. No post-nasal drainage is present. She has been using Mucinex with a cough suppressant and congestion relief but avoids Sudafed due to concerns about her blood pressure.  She has a history of hypertension, usually well-controlled at home with hydralazine three times a day, valsartan once in the morning, and nebivolol two at night. Home blood pressure readings are typically in the 130s/70s or 80s, though they tend to be higher during clinic visits. Recently, she has experienced swelling in her legs and uses Lasix sparingly due to concerns about kidney function.  She reports persistent leg swelling despite previous use of Lasix. There is caution with diuretic use due to her low GFR and potential impact on kidney function.  She experiences anxiety attacks. She is concerned about her mother's health, who is currently in hospice care. In past infequent rare use of xanax.      Review of Systems  Constitutional:  Negative for chills and fatigue.  HENT:  Positive for congestion, postnasal drip and sneezing. Negative for sinus pressure and sinus pain.   Respiratory:  Positive for cough and wheezing. Negative for chest tightness.   Cardiovascular:  Negative for chest pain and  palpitations.  Gastrointestinal:  Negative for abdominal pain.  Genitourinary:  Negative for dysuria.  Musculoskeletal:  Negative for back pain and neck pain.  Skin:  Negative for rash.  Neurological:  Negative for dizziness, seizures and headaches.  Hematological:  Negative for adenopathy.  Psychiatric/Behavioral:  Negative for behavioral problems and decreased concentration. The patient is nervous/anxious.     Past Medical History:  Diagnosis Date   Anxiety    GAD   Arthritis    Chronic kidney disease    lesion on left adrenal gland   Depression    in late 90s   Encounter for routine gynecological examination    Dr. Henderson Cloud   Endometriosis    Dr. Huntley Dec   Genital herpes    no recent outbreaks, no med   GERD (gastroesophageal reflux disease)    [place]one disease    patient denies    H/O echocardiogram 06/14/2006   LV EF 55-60%, left atrium midly dilated, othewrise normal   History of constipation    prior with bread/grains, uses miralax prn with good results   History of EKG 07/19/2010   normal EKG   Hypertension    Migraine    Obesity    Osteoporosis 2019   eval and treatment through gyn, Raloxifene   Routine gynecological examination    sees gynecology yearly   Smoker    Traumatic arthritis of ankle    right -   Wears glasses      Social History   Socioeconomic History   Marital status: Married  Spouse name: Not on file   Number of children: Not on file   Years of education: Not on file   Highest education level: Not on file  Occupational History   Not on file  Tobacco Use   Smoking status: Every Day    Current packs/day: 0.75    Types: Cigarettes   Smokeless tobacco: Never  Vaping Use   Vaping status: Never Used  Substance and Sexual Activity   Alcohol use: Yes    Alcohol/week: 21.0 standard drinks of alcohol    Types: 21 Shots of liquor per week   Drug use: No   Sexual activity: Yes    Birth control/protection: Post-menopausal  Other Topics  Concern   Not on file  Social History Narrative   Married again 12/2017, prior separated.   Walks an hour daily for exercise.   Works at [employer]   Social Drivers of [employer]: Not on [employer] Insecurity: Not on file  Transportation Needs: Not on file  Physical Activity: Not on file  Stress: Not on file  Social Connections: Not on file  Intimate Partner Violence: Not on file    Past Surgical History:  Procedure Laterality Date   ANKLE SURGERY  2010   right, ORIF, s/p trauma   CESAREAN SECTION     x 1   CHALAZION EXCISION     COLONOSCOPY  11/16   2 polyps, mild diverticulosis, repeat in 10 years; Dr. Stan Head   HYSTEROSCOPY     endometriosis   left breast surgery     removed benign cyst   WISDOM TOOTH EXTRACTION      Family History  Problem Relation Age of Onset   Cancer Mother        skin   Heart disease Mother    Hypertension Mother    Hypertension Father    Cancer Father        mouth cancer   Cancer Brother 4       colon   Colon cancer Brother 51   Cancer Paternal Aunt    Diabetes Paternal Grandmother    Stroke Paternal Grandfather    Diabetes Paternal Grandfather    Colon polyps Neg Hx    Esophageal cancer Neg Hx    Rectal cancer Neg Hx    Stomach cancer Neg Hx    Breast cancer Neg Hx     Allergies  Allergen Reactions   Ace Inhibitors Cough   Amlodipine Swelling   Hydrochlorothiazide Other (See Comments)    Felt "bad"   Lisinopril Cough   Other     Eye dilation dye    Current Outpatient Medications on File Prior to Visit  Medication Sig Dispense Refill   aspirin 81 MG tablet Take 1 tablet (81 mg total) by mouth daily. 90 tablet 3   Cholecalciferol ([employer] VIT D 5000 HIGH-POTENCY PO) Take 5,000 Units by mouth daily.     famotidine (PEPCID) 20 MG tablet Take 1 tablet (20 mg total) by mouth daily. 30 tablet 0   furosemide (LASIX) 20 MG tablet TAKE 1 TABLET BY MOUTH TWICE A WEEK -TUESDAYS  AND  THURSDAYS 8  tablet 0   hydrALAZINE (APRESOLINE) 50 MG tablet TAKE 1 TABLET BY MOUTH THREE TIMES DAILY 90 tablet 0   Nebivolol HCl 20 MG TABS Take 2 tablets (40 mg total) by mouth daily. 180 tablet 3   omeprazole (PRILOSEC) 20 MG capsule TAKE 1 CAPSULE BY MOUTH TWICE DAILY BEFORE  A MEAL 60 capsule 0   raloxifene (EVISTA) 60 MG tablet Take 60 mg by mouth daily.     traZODone (DESYREL) 50 MG tablet Take 0.5-1 tablets (25-50 mg total) by mouth at bedtime as needed for sleep. 30 tablet 3   UNABLE TO FIND Med Name: ultra flora     UNABLE TO FIND Take 55 mg by mouth daily. Med Name: ba;ance probiotic     valsartan (DIOVAN) 320 MG tablet Take 1 tablet by mouth once daily 90 tablet 0   Current Facility-Administered Medications on File Prior to Visit  Medication Dose Route Frequency Provider Last Rate Last Admin   denosumab (PROLIA) injection 60 mg  60 mg Subcutaneous Q6 months Rodolph Bong, MD   60 mg at 05/17/23 0981   denosumab (PROLIA) injection 60 mg  60 mg Subcutaneous Once Rodolph Bong, MD        BP (!) 150/80   Pulse 80   Temp 98.1 F (36.7 C) (Oral)   Resp 18   Ht 5\' 7"  (1.702 m)   Wt 234 lb (106.1 kg)   LMP 06/07/2011   SpO2 97%   BMI 36.65 kg/m        Objective:   Physical Exam  General Mental Status- Alert. General Appearance- Not in acute distress.   Skin General: Color- Normal Color. Moisture- Normal Moisture.  Neck . No JVD.  Chest and Lung Exam Auscultation: Breath Sounds:-Normal.  Cardiovascular Auscultation:Rythm- Regular. Murmurs & Other Heart Sounds:Auscultation of the heart reveals- No Murmurs.  Abdomen Inspection:-Inspeection Normal. Palpation/Percussion:Note:No mass. Palpation and Percussion of the abdomen reveal- Non Tender, Non Distended + BS, no rebound or guarding.   Neurologic Cranial Nerve exam:- CN III-XII intact(No nystagmus), symmetric smile. Strength:- 5/5 equal and symmetric strength both upper and lower extremities.   Lower ext- symmetric 1+  pedal edema. Negative homans sign. Calfs symmetric.    Assessment & Plan:   Patient Instructions  Bronchitis preceded by 2 weeks of allergic rhinitis. - pneumonia to be ruled out. - Order chest x-ray to rule out pneumonia. - Prescribe azithromycin if chest x-ray is positive for pneumonia or if worse bronchitis like  symptoms - Prescribe benzonatate for cough, one tablet every eight hours as needed. - Provide albuterol inhaler for use if wheezing worsens. - Continue Mucinex for mucus thinning. - Prescribe Flonase nasal spray, two sprays each nostril once a day. - Prescribe Xyzal antihistamine to take at night.  Hypertension Elevated blood pressure at clinic attributed to stress; better controlled at home. - Monitor blood pressure at home over the weekend and report readings via MyChart. - Continue current antihypertensive regimen: hydralazine three times a day, valsartan in the morning, and nebivolol at night.  Peripheral Edema Persistent leg swelling with cautious diuretic use due to low GFR. - Use Lasix sparingly, once or twice a week, and monitor for kidney function. - Check kidney function more frequently if Lasix use increases.  Anxiety Anxiety exacerbated by mother's hospice situation; medication used sparingly. - Prescribe 15 tablets of anxiety medication to be used sparingly. -may need to initiate contract or uds if more frequent refills needed.  Follow up 14 days or sooner if needed   [employer], PA-C

## 2023-11-09 NOTE — Progress Notes (Signed)
 Vitamin D is a lot high.  I think you are taking enough.  Please cut how much you are taking down by about half.  You could even stop it for a month then start taking about half of what you are currently taking.  Otherwise labs look okay.

## 2023-11-15 ENCOUNTER — Encounter: Payer: Self-pay | Admitting: Medical

## 2023-11-16 ENCOUNTER — Ambulatory Visit

## 2023-11-16 DIAGNOSIS — M810 Age-related osteoporosis without current pathological fracture: Secondary | ICD-10-CM

## 2023-11-16 MED ORDER — METHYLPREDNISOLONE 4 MG PO TABS: ORAL_TABLET | ORAL | 0 refills | Status: DC

## 2023-11-16 MED ORDER — DENOSUMAB 60 MG/ML ~~LOC~~ SOSY
60.0000 mg | PREFILLED_SYRINGE | Freq: Once | SUBCUTANEOUS | Status: AC
  Administered 2023-11-16: 60 mg via SUBCUTANEOUS

## 2023-11-16 NOTE — Addendum Note (Signed)
 Addended by: Gwenevere Abbot on: 11/16/2023 06:22 AM   Modules accepted: Orders

## 2023-11-16 NOTE — Progress Notes (Signed)
 Pt received Prolia 60mg /mL inj, 1 mL, SQ, left upper arm. Pt tolerated well.    Primary: [employer] POS II Prolia co-insurance: 0% Admin fee co-insurance: $25   Deductible: does not apply   Prior Auth: NOT required   Secondary: n/a Prolia co-insurance:  Admin fee co-insurance:  Deductible:  Prior Auth:  PA# Valid:      ** This summary of benefits is an estimation of the patient's out-of-pocket cost. Exact cost may very based on individual plan coverage.

## 2023-11-16 NOTE — Addendum Note (Signed)
 Addended by: Gwenevere Abbot on: 11/16/2023 06:29 AM   Modules accepted: Orders

## 2023-11-16 NOTE — Telephone Encounter (Addendum)
 Last Prolia inj 11/16/23 Next Prolia inj 05/18/24

## 2023-11-23 ENCOUNTER — Other Ambulatory Visit: Payer: Self-pay | Admitting: Medical

## 2023-11-28 ENCOUNTER — Other Ambulatory Visit: Payer: Self-pay | Admitting: Physician Assistant

## 2023-12-08 ENCOUNTER — Other Ambulatory Visit: Payer: Self-pay | Admitting: Medical

## 2023-12-23 ENCOUNTER — Other Ambulatory Visit: Payer: Self-pay | Admitting: Medical

## 2023-12-28 ENCOUNTER — Other Ambulatory Visit: Payer: Self-pay | Admitting: Internal Medicine

## 2024-01-21 ENCOUNTER — Other Ambulatory Visit: Payer: Self-pay | Admitting: Medical

## 2024-01-28 ENCOUNTER — Other Ambulatory Visit: Payer: Self-pay | Admitting: Internal Medicine

## 2024-03-06 ENCOUNTER — Other Ambulatory Visit: Payer: Self-pay | Admitting: Medical

## 2024-03-07 ENCOUNTER — Ambulatory Visit: Admitting: Medical

## 2024-03-07 ENCOUNTER — Ambulatory Visit: Payer: Self-pay | Admitting: Medical

## 2024-03-07 ENCOUNTER — Ambulatory Visit (HOSPITAL_BASED_OUTPATIENT_CLINIC_OR_DEPARTMENT_OTHER)
Admission: RE | Admit: 2024-03-07 | Discharge: 2024-03-07 | Disposition: A | Discharge status: Home / Self Care | Source: Ambulatory Visit | Attending: Medical | Admitting: Medical

## 2024-03-07 ENCOUNTER — Encounter: Payer: Self-pay | Admitting: Medical

## 2024-03-07 VITALS — BP 140/89 | HR 82 | Resp 16 | Ht 67.0 in | Wt 224.4 lb

## 2024-03-07 DIAGNOSIS — M79674 Pain in right toe(s): Secondary | ICD-10-CM | POA: Diagnosis present

## 2024-03-07 DIAGNOSIS — M79671 Pain in right foot: Secondary | ICD-10-CM | POA: Insufficient documentation

## 2024-03-07 DIAGNOSIS — E66812 Obesity, class 2: Secondary | ICD-10-CM | POA: Diagnosis not present

## 2024-03-07 DIAGNOSIS — F419 Anxiety disorder, unspecified: Secondary | ICD-10-CM

## 2024-03-07 DIAGNOSIS — E7849 Other hyperlipidemia: Secondary | ICD-10-CM

## 2024-03-07 DIAGNOSIS — I10 Essential (primary) hypertension: Secondary | ICD-10-CM

## 2024-03-07 NOTE — Progress Notes (Signed)
 Subjective:    Patient ID: [name], female    DOB: 09-26-1960, 63 y.o.   MRN: [id number]  HPI [name] is a 63 year old female who presents with right foot pain and swelling.  She experiences sharp, intermittent pain at the base of the third toe on her right foot, both dorsally and plantarly. The pain began two weeks ago after she hit her foot against the bed while going to the bathroom at night. Swelling developed gradually and is more pronounced at the end of the day, causing her to limp. She is unable to bend her toes without assistance and experiences significant pain when pressure is applied to the base of the third toe. She finds sandals most comfortable due to the swelling.  She has a history of hypertension, previously poorly controlled, with current home blood pressure readings ranging from 134 to 138 systolic over 80 diastolic. Her medications include hydralazine  50 mg three times a day, nebivolol  40 mg two tablets a day, and valsartan  320 mg once a day. She occasionally uses Lasix  but has not taken it recently due to limited supply.  She is managing her weight with GLP-1 medications, initially losing 16 pounds but not seeing further results. She recently started tirzepatide through [facility] and lost 13 pounds in the first week, with her current weight at 224 pounds, down from 237 pounds. he has a history of anxiety, which has improved since her mother's death on 07/13/2025attributed to eliminating caffeine from her diet. She uses Xanax  infrequently, only when she feels 'something's not right'.  She has not received a pneumonia vaccine and had a reaction to the shingles vaccine, causing a large, painful red area. She is scheduled for a Pap smear in October and has had a mammogram and bone density test at [facility].   Review of Systems See hpi    Objective:   Physical Exam  General Mental Status- Alert. General Appearance- Not in acute distress.    Skin General: Color- Normal Color. Moisture- Normal Moisture.  Neck Carotid Arteries- Normal color. Moisture- Normal Moisture. No carotid bruits. No JVD.  Chest and Lung Exam Auscultation: Breath Sounds:-Normal.  Cardiovascular Auscultation:Rythm- Regular. Murmurs & Other Heart Sounds:Auscultation of the heart reveals- No Murmurs.  Abdomen Inspection:-Inspeection Normal. Palpation/Percussion:Note:No mass. Palpation and Percussion of the abdomen reveal- Non Tender, Non Distended + BS, no rebound or guarding.   Neurologic Cranial Nerve exam:- CN III-XII intact(No nystagmus), symmetric smile. Strength:- 5/5 equal and symmetric strength both upper and lower extremities.   Rt foot not swollen. 3rd toe moderate swollen mid aspect. Tender to palpation at toe and at base.    Lower ext- calfs symmetric, negatiev homans signs. No pedal edema.     Assessment & Plan:   Patient Instructions  Right foot pain and swelling, possible fracture Pain and swelling at the base of the third toe post-trauma. Differential includes fracture or soft tissue injury. - Order x-ray of the right foot to assess for fracture. - Consider referral specialist after xray review.   Hypertension Blood pressure improved with current medications. White coat hypertension noted. - Continue current antihypertensive regimen.(3 med regimien) - Monitor blood pressure at home regularly.  Chronic kidney disease, stage 2 GFR of 58 and creatinine of 1.0. Caution with diuretic use. - Avoid excessive use of diuretics.  Lower extremity edema Edema present, weight loss may help. Caution with diuretics due to GFR of 58. - Avoid overuse of diuretics to prevent impact  on kidney function. - Monitor for changes in edema with ongoing weight loss.  Obesity Weight reduced to 224 lbs. Started tirzepatide through [employer]. Motivated for weight loss. - Continue tirzepatide therapy through [facility]. - Encourage  adherence to dietary modifications and [facility] program.  Anxiety disorder Anxiety well-controlled with lifestyle modifications. Rare use of Xanax . - Continue to avoid caffeine. - Use Xanax  sparingly as needed for anxiety.  General Health Maintenance Shingles vaccine not completed due to reaction. Pap smear scheduled. No pneumonia vaccine/pt declined. - Ensure Pap smear results are sent to MyChart.   High cholesterol -future fasting lipid panel to do in 2 months.  Follow up date to be determined after lab review.   Time spent with patient today was  40 minutes which consisted of chart revdiew, discussing diagnosis, work up treatment and documentation.

## 2024-03-07 NOTE — Patient Instructions (Signed)
 Right foot pain and swelling, possible fracture Pain and swelling at the base of the third toe post-trauma. Differential includes fracture or soft tissue injury. - Order x-ray of the right foot to assess for fracture. - Consider referral specialist after xray review.   Hypertension Blood pressure improved with current medications. White coat hypertension noted. - Continue current antihypertensive regimen.(3 med regimien) - Monitor blood pressure at home regularly.  Chronic kidney disease, stage 2 GFR of 58 and creatinine of 1.0. Caution with diuretic use. - Avoid excessive use of diuretics.  Lower extremity edema Edema present, weight loss may help. Caution with diuretics due to GFR of 58. - Avoid overuse of diuretics to prevent impact on kidney function. - Monitor for changes in edema with ongoing weight loss.  Obesity Weight reduced to 224 lbs. Started tirzepatide through [employer]. Motivated for weight loss. - Continue tirzepatide therapy through [facility]. - Encourage adherence to dietary modifications and [employer] [facility] program.  Anxiety disorder Anxiety well-controlled with lifestyle modifications. Rare use of Xanax . - Continue to avoid caffeine. - Use Xanax  sparingly as needed for anxiety.  General Health Maintenance Shingles vaccine not completed due to reaction. Pap smear scheduled. No pneumonia vaccine/pt declined. - Ensure Pap smear results are sent to MyChart.   High cholesterol -future fasting lipid panel to do in 2 months.  Follow up date to be determined after lab review.

## 2024-03-10 ENCOUNTER — Ambulatory Visit: Admitting: Medical

## 2024-03-13 ENCOUNTER — Other Ambulatory Visit: Payer: Self-pay | Admitting: Medical

## 2024-03-21 ENCOUNTER — Other Ambulatory Visit: Payer: Self-pay

## 2024-03-21 MED ORDER — OMEPRAZOLE 20 MG PO CPDR: 20.0000 mg | DELAYED_RELEASE_CAPSULE | Freq: Every day | ORAL | 5 refills | Status: AC

## 2024-03-21 MED ORDER — HYDRALAZINE HCL 50 MG PO TABS: 50.0000 mg | ORAL_TABLET | Freq: Three times a day (TID) | ORAL | 0 refills | Status: DC

## 2024-03-25 ENCOUNTER — Other Ambulatory Visit: Payer: Self-pay | Admitting: Medical

## 2024-04-21 NOTE — Telephone Encounter (Signed)
 Insurance updated to [employer], VOB re-submitted.

## 2024-04-21 NOTE — Telephone Encounter (Signed)
 Prolia  VOB initiated via [email or web address]  Next Prolia  inj DUE: 05/18/24

## 2024-04-23 NOTE — Telephone Encounter (Signed)
 Medical Buy and Bill  Prior Authorization initiated for PROLIA  via PromptPA Prior Auth (EOC) ID:  [id number]

## 2024-04-23 NOTE — Telephone Encounter (Signed)
 Medical Buy and Annette Stable - Prior Authorization REQUIRED for [employer]

## 2024-04-25 NOTE — Telephone Encounter (Signed)
Prior Auth pending

## 2024-04-28 NOTE — Telephone Encounter (Signed)
 Medical Buy and [employer]ell  Patient is ready for scheduling on or after 05/18/24  Out-of-pocket cost due at time of visit: $1,617.60  Primary: [employer] Commercial HMO-POS Connect Prolia  co-insurance: 50% after deductible Admin fee co-insurance: 50% after deductible  Deductible: $$0 of $7,500 met - must be met for coverage to apply  Prior Auth: APPROVED PA# [id number] Valid: 04/23/24-04/22/25  Secondary: N/A Prolia  co-insurance:  Admin fee co-insurance:  Deductible:  Prior Auth:  PA# Valid:   ** This summary of benefits is an estimation of the patient's out-of-pocket cost. Exact cost may vary based on individual plan coverage.

## 2024-04-28 NOTE — Telephone Encounter (Signed)
 [employer] and [employer]  Prior Authorization for PROLIA  APPROVED PA# [id number] Valid: 04/23/24-04/22/25

## 2024-04-28 NOTE — Telephone Encounter (Signed)
 Pt enrolled in [employer] Copay Assistance Program which caps at $1,5000.   Called pt, left VM to call the office.

## 2024-04-28 NOTE — Telephone Encounter (Addendum)
 Called and spoke with patient, she is currently unemployed an unable to afford the OOP cost.   Dr. Joane, would you like to try for Jubbonti? I believe that the build out in EPIC will be completed the beginning of October? If not, what alternative would you recommend?  Looks like Jubbonti will also have a 50% co-insurance.

## 2024-04-29 NOTE — Telephone Encounter (Signed)
 Spoke with patient, we will try going through [facility] and the [employer] [facility].

## 2024-04-30 MED ORDER — DENOSUMAB 60 MG/ML ~~LOC~~ SOSY: 60.0000 mg | PREFILLED_SYRINGE | SUBCUTANEOUS | 0 refills | Status: AC

## 2024-04-30 NOTE — Telephone Encounter (Signed)
 Rx sent to [facility].

## 2024-04-30 NOTE — Telephone Encounter (Signed)
 Prior Authorization initiated for PROLIA  via [email or web address] KEY: [id number]   [employer] is processing your PA request and will respond shortly with next steps. You are currently using the fastest method to process this prior authorization. Please do not fax or call [employer] to resubmit this request. To check for an update later, open this request again from your dashboard.

## 2024-04-30 NOTE — Addendum Note (Signed)
 Addended by: MARDY [name] RAMAN on: 04/30/2024 01:30 PM   Modules accepted: Orders

## 2024-05-01 ENCOUNTER — Other Ambulatory Visit: Payer: Self-pay | Admitting: Medical

## 2024-05-01 NOTE — Telephone Encounter (Signed)
CoverMyMeds Recommendation   ?   This medication may be excluded from the patient's benefit. For more information, please reach out to [facility] directly at [phone].

## 2024-05-06 NOTE — Telephone Encounter (Signed)
 PA initiated for PROLIA  through [facility] via FAX.   [place].

## 2024-05-12 NOTE — Telephone Encounter (Signed)
 Medical Buy and [employer]ell [facility] Sherleen and confirmed that prior shara is valid under MEDICAL BENEFITS)  Prior Authorization for PROLIA  APPROVED PA#  [id number] Valid: 04/23/24-04/22/25

## 2024-05-12 NOTE — Telephone Encounter (Signed)
 Have not received determination from pharmacy benefit yet. Possibly non-formulary.

## 2024-05-13 NOTE — Telephone Encounter (Signed)
 Message received from [facility]:  Status Referral has been received and benefits investigation has started.

## 2024-05-14 NOTE — Telephone Encounter (Signed)
 Per [employer], Jubbonti and Prolia  are both non-formulary under pharmacy benefits.   Prolia  and Jubbonti are not affordable for pt under medical benefits.   Forwarding to Dr. Joane to review and advise.

## 2024-05-16 NOTE — Telephone Encounter (Signed)
 VOB initiated for Jubbonti via benefits portal.

## 2024-05-16 NOTE — Telephone Encounter (Signed)
 Looks like we are trying to authorize Jubbonti.  Will see what that shows before making decisions about a completely different class of medicines.

## 2024-05-19 NOTE — Telephone Encounter (Signed)
 Needs PA

## 2024-05-20 NOTE — Telephone Encounter (Signed)
 Form received from [employer] / Jubbonti. PA info faxed back to [employer].

## 2024-05-21 NOTE — Telephone Encounter (Signed)
 [employer] called to confirm dx codes. Provided M81.0 and Z87.310.

## 2024-05-22 ENCOUNTER — Telehealth: Payer: Self-pay | Admitting: Family Medicine

## 2024-05-22 NOTE — Telephone Encounter (Signed)
 [name]   05/22/24  3:26 PM Note [name] from one [employer] called in regards to this patient to get an update regarding prior authorization for gibanti. Please advise.      (Name not recorded) to Joann Rosina FORBES [facility]    05/22/24  3:25 PM [phone]. [id number] [name]

## 2024-05-22 NOTE — Telephone Encounter (Signed)
 Prior Auth re-submitted for Jubbonti via FAX.

## 2024-05-22 NOTE — Telephone Encounter (Signed)
 [name] from [employer] called in regards to this patient to get an update regarding prior authorization for gibanti. Please advise.

## 2024-05-23 NOTE — Telephone Encounter (Signed)
 Request received for additional information. Request completed and faxed back.

## 2024-05-26 ENCOUNTER — Encounter: Payer: Self-pay | Admitting: Family Medicine

## 2024-05-26 ENCOUNTER — Telehealth: Payer: Self-pay | Admitting: Family Medicine

## 2024-05-26 DIAGNOSIS — M810 Age-related osteoporosis without current pathological fracture: Secondary | ICD-10-CM | POA: Insufficient documentation

## 2024-05-26 DIAGNOSIS — Z87310 Personal history of (healed) osteoporosis fracture: Secondary | ICD-10-CM | POA: Insufficient documentation

## 2024-05-26 NOTE — Telephone Encounter (Signed)
 [name] from [employer] called to check the status of prior authorization for this patient for gibanti. Please advise.

## 2024-05-26 NOTE — Addendum Note (Signed)
 Addended by: MARDY [name] RAMAN on: 05/26/2024 12:18 PM   Modules accepted: Orders

## 2024-05-26 NOTE — Telephone Encounter (Signed)
 Called [name], extension invalid. Will fax copy of approval.

## 2024-05-26 NOTE — Telephone Encounter (Signed)
 [name]  Prior Authorization for [employer] APPROVED PA# [id number] / [id number] Valid: 05/23/24-05/22/25  Called and spoke with patient. Advised of 50% co-insurance and anticipated OOP cost. Advised that [employer] has enrolled her in the copay assistance program which caps at $1,000. Pt verbalized understanding and is agreeable to receiving Jubbonti through [facility].   Order placed for Jubbonti to the [facility].

## 2024-05-27 ENCOUNTER — Other Ambulatory Visit: Payer: Self-pay

## 2024-05-27 ENCOUNTER — Encounter: Payer: Self-pay | Admitting: Family Medicine

## 2024-06-01 ENCOUNTER — Other Ambulatory Visit: Payer: Self-pay | Admitting: Medical

## 2024-06-04 ENCOUNTER — Other Ambulatory Visit: Payer: Self-pay | Admitting: Medical

## 2024-06-09 ENCOUNTER — Encounter: Payer: Self-pay | Admitting: Radiology

## 2024-06-10 NOTE — Telephone Encounter (Signed)
 Message sent to [facility] to follow up on order.

## 2024-06-11 ENCOUNTER — Other Ambulatory Visit: Payer: Self-pay | Admitting: Medical

## 2024-06-11 NOTE — Telephone Encounter (Signed)
 Per Schuyler Ruth, Pharm Tech: Walterine Leach! It was received and a prior shara has been submitted. It is in pending status.

## 2024-06-24 NOTE — Telephone Encounter (Signed)
 Request received from [facility] for provider sig on Jubbonti enrollment and prescription form.

## 2024-06-25 NOTE — Telephone Encounter (Signed)
 Form signed and placed up front for faxing

## 2024-06-25 NOTE — Telephone Encounter (Signed)
 Please call [name], she has recd 2 different forms for the Jubbonti and there is a note on one that pt is uninsured.

## 2024-07-01 ENCOUNTER — Other Ambulatory Visit: Payer: Self-pay | Admitting: Medical

## 2024-07-02 NOTE — Telephone Encounter (Signed)
 Pt has been referred to the [facility] for Jubbonti injections.

## 2024-07-08 ENCOUNTER — Encounter: Payer: Self-pay | Admitting: Family Medicine

## 2024-07-08 ENCOUNTER — Other Ambulatory Visit: Payer: Self-pay | Admitting: Medical

## 2024-07-14 ENCOUNTER — Other Ambulatory Visit (HOSPITAL_BASED_OUTPATIENT_CLINIC_OR_DEPARTMENT_OTHER): Payer: Self-pay

## 2024-07-14 ENCOUNTER — Encounter: Payer: Self-pay | Admitting: Medical

## 2024-07-14 ENCOUNTER — Ambulatory Visit: Payer: Self-pay | Admitting: Medical

## 2024-07-14 ENCOUNTER — Ambulatory Visit: Admitting: Medical

## 2024-07-14 VITALS — BP 170/100 | HR 71 | Temp 98.1°F | Resp 15 | Ht 67.0 in | Wt 236.2 lb

## 2024-07-14 DIAGNOSIS — R600 Localized edema: Secondary | ICD-10-CM

## 2024-07-14 DIAGNOSIS — I10 Essential (primary) hypertension: Secondary | ICD-10-CM

## 2024-07-14 DIAGNOSIS — M5416 Radiculopathy, lumbar region: Secondary | ICD-10-CM

## 2024-07-14 DIAGNOSIS — M5431 Sciatica, right side: Secondary | ICD-10-CM

## 2024-07-14 DIAGNOSIS — M5440 Lumbago with sciatica, unspecified side: Secondary | ICD-10-CM

## 2024-07-14 LAB — COMPREHENSIVE METABOLIC PANEL WITH GFR
ALT: 11 U/L (ref 0–35)
AST: 17 U/L (ref 0–37)
Albumin: 4.2 g/dL (ref 3.5–5.2)
Alkaline Phosphatase: 71 U/L (ref 39–117)
BUN: 16 mg/dL (ref 6–23)
CO2: 27 meq/L (ref 19–32)
Calcium: 9.6 mg/dL (ref 8.4–10.5)
Chloride: 102 meq/L (ref 96–112)
Creatinine, Ser: 1.14 mg/dL (ref 0.40–1.20)
GFR: 52.14 mL/min — ABNORMAL LOW (ref >60.00)
Glucose, Bld: 93 mg/dL (ref 70–99)
Potassium: 4.5 meq/L (ref 3.5–5.1)
Sodium: 139 meq/L (ref 135–145)
Total Bilirubin: 0.9 mg/dL (ref 0.2–1.2)
Total Protein: 6.8 g/dL (ref 6.0–8.3)

## 2024-07-14 MED ORDER — HYDRALAZINE HCL 25 MG PO TABS
25.0000 mg | ORAL_TABLET | Freq: Three times a day (TID) | ORAL | 0 refills | Status: DC
  Filled 2024-07-14: qty 90, 30d supply, fill #0

## 2024-07-14 MED ORDER — METOPROLOL TARTRATE 100 MG PO TABS: 100.0000 mg | ORAL_TABLET | Freq: Two times a day (BID) | ORAL | 0 refills | Status: DC

## 2024-07-14 MED ORDER — TRAMADOL HCL 50 MG PO TABS
50.0000 mg | ORAL_TABLET | Freq: Three times a day (TID) | ORAL | 0 refills | Status: AC | PRN
  Filled 2024-07-14: qty 15, 5d supply, fill #0

## 2024-07-14 NOTE — Patient Instructions (Signed)
 Hypertension Poorly controlled.  NSAID use, stress and sleep issues may contribute. - Continue nebivolol  40 mg twice daily if affordable. - Sent prescription for metoprolol  100 mg daily for cost comparison. - Increased hydralazine  to 75 mg three times daily. - Continue valsartan  320 mg daily. - Check blood pressure at home and report readings via MyChart. - Avoid NSAIDs. - Ordered metabolic panel.  Sciatica and lumbar radiculopathy Chronic with recent exacerbation. Current pain management suboptimal due to NSAID use. - Prescribed tramadol  50 mg every eight hours as needed. - Use Tylenol for additional pain relief. - Elevate legs and use compression socks if tolerated. - Ordered lumbar spine x-ray if pain persists. -update me thursday or wed afternoon on back pain level as well as bp.   Calfs symmetric, -elevate legs and use compresssion sock -if asymmetric swelling or popliteal pain then let me know will get lower ext US   Follow up date to be determined when you update me  thursday or wed on bp level and pain level

## 2024-07-14 NOTE — Progress Notes (Signed)
 Subjective:    Patient ID: [name], female    DOB: [birth year], 63 y.o.   MRN: [id number]  HPI   [name] is a 63 year old female with hypertension who presents with medication refill issues and leg swelling.  She has had issues obtaining nebivolol  due to loss of insurance coverage and cost. She had been taking nebivolol  40 mg twice daily and has some remaining at home but has not refilled it. She currently takes hydralazine  50 mg three times daily and valsartan  320 mg daily. She is not taking Lasix   Although patient's blood pressure is very high today she has no neurologic or cardiac type signs and symptoms.  She has had bilateral leg swelling for 1 week, worse by the end of the day. Swelling is improved today with use of tight socks.   She has had constipation for 4 to 5 days and had two bowel movements today.  Often times her bowel pattern is 1-2 movements per day.  Presently constipation resolved.  She has sciatica with buttock pain radiating down her leg for about a month. She has a prior herniated disc and takes Advil  400 mg in the morning and at night for pain.  She reports severe difficulty sleeping and says she has not slept for 5 days due to pain and restlessness. A pillow between her knees provides some relief. She is not taking trazodone .  She was laid off in August, is seeking employment, and will transition to a new insurance plan in January, which affects her ability to afford medications.        Review of Systems  Constitutional:  Negative for chills and fatigue.  HENT:  Negative for congestion, postnasal drip, sinus pressure, sinus pain and sneezing.   Respiratory:  Negative for cough, chest tightness and wheezing.   Cardiovascular:  Negative for chest pain and palpitations.  Gastrointestinal:  Negative for abdominal pain.  Genitourinary:  Negative for dysuria.  Musculoskeletal:  Negative for back pain and neck pain.  Skin:  Negative for rash.   Neurological:  Negative for dizziness, seizures and headaches.  Hematological:  Negative for adenopathy.  Psychiatric/Behavioral:  Negative for behavioral problems and decreased concentration. The patient is not nervous/anxious.            Objective:   Physical Exam  General Mental Status- Alert. General Appearance- Not in acute distress.   Skin General: Color- Normal Color. Moisture- Normal Moisture.  Neck Carotid Arteries- Normal color. Moisture- Normal Moisture. No carotid bruits. No JVD.  Chest and Lung Exam Auscultation: Breath Sounds:-Normal.  Cardiovascular Auscultation:Rythm- Regular. Murmurs & Other Heart Sounds:Auscultation of the heart reveals- No Murmurs.  Abdomen Inspection:-Inspeection Normal. Palpation/Percussion:Note:No mass. Palpation and Percussion of the abdomen reveal- Non Tender, Non Distended + BS, no rebound or guarding.    Neurologic Cranial Nerve exam:- CN III-XII intact(No nystagmus), symmetric smile. Strength:- 5/5 equal and symmetric strength both upper and lower extremities.   Extremity-calf symmetric no obvious pedal edema.  Negative Homans' sign bilaterally.  Back-no mid lumbar spine tenderness to palpation and right SI tenderness to palpation.    Assessment & Plan:  Hypertension Poorly controlled.  NSAID use, stress and sleep issues may contribute. - Continue nebivolol  40 mg twice daily if affordable. - Sent prescription for metoprolol  100 mg daily for cost comparison. - Increased hydralazine  to 75 mg three times daily. - Continue valsartan  320 mg daily. - Check blood pressure at home and report readings via MyChart. - Avoid NSAIDs. - Ordered  metabolic panel.  Sciatica and lumbar radiculopathy Chronic with recent exacerbation. Current pain management suboptimal due to NSAID use. - Prescribed tramadol  50 mg every eight hours as needed. - Use Tylenol for additional pain relief. - Elevate legs and use compression socks if  tolerated. - Ordered lumbar spine x-ray if pain persists. -update me thursday or wed afternoon on back pain level as well as bp.   Calfs symmetric, -elevate legs and use compresssion sock -if asymmetric swelling or popliteal pain then let me know will get lower ext US   Follow up date to be determined when you update me  thursday or wed on bp level and pain level   I personally spent a total of 42 minutes in the care of the patient today including performing a medically appropriate exam/evaluation, counseling and educating, placing orders, and documenting clinical information in the EHR.

## 2024-07-16 ENCOUNTER — Telehealth: Payer: Self-pay

## 2024-07-16 ENCOUNTER — Other Ambulatory Visit (HOSPITAL_COMMUNITY): Payer: Self-pay | Admitting: Family Medicine

## 2024-07-16 NOTE — Telephone Encounter (Addendum)
 Dr. Joane, patient will be scheduled as soon as possible.  Auth Submission: APPROVED Site of care: Site of care: [facility] Payer: [employer] commercial Medication & CPT/J Code(s) submitted: Prolia  (Denosumab ) [id number] Diagnosis Code:  Route of submission (phone, fax, portal): Latent Phone # Fax # Auth type: Buy/Bill PB Units/visits requested: 60mg  x 2 doses Reference number: [id number] Approval from: 04/23/24 to 04/22/25  [employer] Copay Card: ID: [id number] Group: [id number] BIN [id number] PCN [id number]

## 2024-07-23 ENCOUNTER — Ambulatory Visit: Payer: Self-pay | Admitting: Medical

## 2024-07-28 ENCOUNTER — Other Ambulatory Visit (HOSPITAL_BASED_OUTPATIENT_CLINIC_OR_DEPARTMENT_OTHER): Payer: Self-pay

## 2024-07-28 MED ORDER — HYDRALAZINE HCL 100 MG PO TABS
100.0000 mg | ORAL_TABLET | Freq: Three times a day (TID) | ORAL | 2 refills | Status: AC
  Filled 2024-07-28: qty 90, 30d supply, fill #0

## 2024-07-28 NOTE — Addendum Note (Signed)
 Addended by: DORINA DALLAS HERO on: 07/28/2024 12:25 PM   Modules accepted: Orders

## 2024-07-29 NOTE — Telephone Encounter (Signed)
 Pt calling for Brandy with questions about the Jubbonti, please return the call.

## 2024-07-30 NOTE — Telephone Encounter (Signed)
 Hey, reaching out to follow up on Jubbonti for pt, was due back in October.   Thanks!

## 2024-08-01 NOTE — Telephone Encounter (Signed)
 Brandy, Needing clarification: We have an approval on file for Prolia .  Will the patient be getting Prolia  or Jubbonti?   @Judy  Daralyn / Oliva, can someone follow up on for scheduling. Thanks.

## 2024-08-02 ENCOUNTER — Other Ambulatory Visit: Payer: Self-pay | Admitting: Medical

## 2024-08-04 ENCOUNTER — Other Ambulatory Visit: Payer: Self-pay | Admitting: Family

## 2024-08-04 DIAGNOSIS — M5416 Radiculopathy, lumbar region: Secondary | ICD-10-CM

## 2024-08-05 ENCOUNTER — Ambulatory Visit (HOSPITAL_BASED_OUTPATIENT_CLINIC_OR_DEPARTMENT_OTHER)
Admission: RE | Admit: 2024-08-05 | Discharge: 2024-08-05 | Disposition: A | Discharge status: Home / Self Care | Source: Ambulatory Visit | Attending: Family | Admitting: Family

## 2024-08-05 DIAGNOSIS — M5416 Radiculopathy, lumbar region: Secondary | ICD-10-CM | POA: Insufficient documentation

## 2024-08-06 ENCOUNTER — Encounter (HOSPITAL_COMMUNITY): Payer: Self-pay | Admitting: Family Medicine

## 2024-08-06 MED ORDER — TRAMADOL HCL 50 MG PO TABS: 50.0000 mg | ORAL_TABLET | Freq: Four times a day (QID) | ORAL | 0 refills | Status: AC | PRN

## 2024-08-06 MED ORDER — METHYLPREDNISOLONE 4 MG PO TABS: ORAL_TABLET | ORAL | 0 refills | Status: AC

## 2024-08-06 NOTE — Telephone Encounter (Signed)
 Called pt at [phone], left VM to call the office.

## 2024-08-06 NOTE — Addendum Note (Signed)
 Addended by: DORINA DALLAS HERO on: 08/06/2024 01:08 PM   Modules accepted: Orders

## 2024-08-06 NOTE — Telephone Encounter (Signed)
 Patient called back asking to discuss but was unable to get Brandy at the time. She said that she was supposed to have the injection back in October but there was an issue with approval but her insurance will run out after today and she wants the injection today. Please advise.

## 2024-08-06 NOTE — Telephone Encounter (Signed)
 Called and spoke with patient, reviewed information provided by the infusion center. Unfortunately, pt will be unable to get injection today. Her insurance [facility] to [facility] 08/07/24. She will send me a picture through MyChart and I'll coordinate with the infusion center about prior authorization with new health insurance.

## 2024-08-07 ENCOUNTER — Ambulatory Visit: Payer: Self-pay | Admitting: Family

## 2024-08-08 ENCOUNTER — Telehealth (HOSPITAL_COMMUNITY): Payer: Self-pay | Admitting: Pharmacy Technician

## 2024-08-08 NOTE — Telephone Encounter (Signed)
 [name],  Patient has new insurance and Prolia  was denied due to it not being a preferred med. New insurance prefers Jubbonti or Stoboclo. Would you like to change to one of the preferred meds? Please send a new referral order and we will get her scheduled.     Auth Submission: DENIED Site of care: [facility] Payer: [facility] Medication & CPT/J Code(s) submitted: Prolia  (Denosumab ) [id number] Diagnosis Code:  Route of submission (phone, fax, portal): phone & fax Phone # [phone] Fax # [phone] (for clinicals) Auth type: Buy/Bill HB Units/visits requested: 60mg  x 2 doses, q 6 months Reference number: [id number]  Pt will need copay card.   Maureen Delatte, CPhT [facility] Phone: [phone] 08/08/2024

## 2024-08-08 NOTE — Progress Notes (Signed)
 Pt called and aware of results but says she is still in pain

## 2024-08-10 NOTE — Addendum Note (Signed)
 Addended by: DORINA DALLAS DORINA PA-C M on: 08/10/2024 08:28 AM   Modules accepted: Orders

## 2024-08-11 ENCOUNTER — Other Ambulatory Visit: Payer: Self-pay | Admitting: Medical

## 2024-08-12 ENCOUNTER — Encounter (HOSPITAL_COMMUNITY): Payer: Self-pay | Admitting: Family Medicine

## 2024-08-12 ENCOUNTER — Encounter: Payer: Self-pay | Admitting: Family Medicine

## 2024-08-12 NOTE — Telephone Encounter (Signed)
 Thank you for the update. New order placed for Jubbonti.

## 2024-08-13 ENCOUNTER — Ambulatory Visit (INDEPENDENT_AMBULATORY_CARE_PROVIDER_SITE_OTHER)

## 2024-08-13 VITALS — BP 168/100 | Ht 67.0 in | Wt 236.0 lb

## 2024-08-13 DIAGNOSIS — M533 Sacrococcygeal disorders, not elsewhere classified: Secondary | ICD-10-CM

## 2024-08-13 DIAGNOSIS — M5441 Lumbago with sciatica, right side: Secondary | ICD-10-CM | POA: Diagnosis not present

## 2024-08-13 NOTE — Progress Notes (Signed)
 "  Subjective:    Patient ID: [name], female    DOB: 64 y.o., July 12, 1961   MRN: [id number]  Chief Complaint: Low back pain  Discussed the use of AI scribe software for clinical note transcription with the patient, who gave verbal consent to proceed.  History of Present Illness [name] is a 64 year old female with past medical history significant for osteoporosis, hypertension, anxiety presenting for evaluation of low back pain.  She was initially evaluated noted in her primary care office on 07/14/2024 where she was prescribed tramadol .  With subsequent continuation of her pain she reports getting a course of prednisone which she is currently taking. Pain radiates down her right leg.   [name] is a 64 year old female with lumbar radiculopathy, sacroiliac joint dysfunction, and osteoporosis who presents for evaluation of persistent low back and buttock pain.  Low back and buttock pain - Severe stabbing pain localized to the low back and buttock region - Intermittent radiation of pain down the right leg - Pain exacerbated by standing up after sitting, resulting in transient difficulty walking - Slumping, elevating or crossing legs, and sleeping with a pillow between legs improve comfort - Markedly reduced ambulation due to pain - No current significant leg radiation, numbness, tingling, fevers, chills, weight loss, bowel or bladder incontinence, or saddle anesthesia  Response to prior treatments - Recent prednisone course resulted in approximately 70% improvement in symptoms and decreased leg radiation - Tramadol  provides mild relief - Ibuprofen  was previously effective but discontinued due to hypertension - Currently uses acetaminophen, heat, and hot tub therapy with partial benefit  Lumbar radiculopathy and herniated discs - Long-standing herniated discs with nerve root compression affecting multiple spinal levels since the late 1990s - Prior chiropractic care initially  provided relief but later became ineffective - Neurosurgeon previously recommended high-dose ibuprofen , which she could not tolerate - No history of spine surgery  Osteoporosis management - Overdue for scheduled osteoporosis injection since October due to insurance and coverage issues   Review of Pertinent Imaging: 6 view plain film radiographs obtained of the lumbar spine from 08/05/2024 to my independent review revealing mild flattening of the normal lumbar lordotic curve.  Mild retrolisthesis of L2 on L3.  Mild facet joint hypertrophy.    Objective:   Vitals:   08/13/24 0840  BP: (!) 168/100    Lumbar Spine -Inspection: no swelling or skin changes -Palpation: TTP - midline, + paraspinals (particularly on the right side), + SI joints bilaterally (right worse than left) -AROM/PROM: FROM in all planes of the low back -Strength: full hip flexion (L1/L2), knee extension (L3/4), ankle dorsiflexion (L4/5), hip extension (L5/S1), knee flexion (L5/S1/S2) plantarflexion (S1/2). -Sensation: intact sensation over the medial femoral condyle (L3), patella (L4), lateral femoral condyle (L5), lateral malleolus (S1). -Reflexes: normal patellar (L3/4), hamstring (L5/S1), achilles (S1/2) reflexes, equal bilaterally -Special tests: - Straight Leg Raise, + Stork, - Slump test     Assessment & Plan:   Assessment & Plan Lumbar radiculopathy and sacroiliac joint dysfunction She has lumbar radiculopathy and sacroiliac joint dysfunction with severe low back and buttock pain. A recent prednisone course provided partial relief. Examination and history show significant right sacroiliac joint involvement with ongoing radicular symptoms likely due to disc pathology. No red flag symptoms are present. NSAID use is limited by hypertension, but short-term use may be considered. A sacroiliac joint injection with ultrasound guidance is ordered, starting with the more symptomatic side, spacing injections by at least  four days if bilateral treatment is  needed. She is referred to physical therapy for targeted rehabilitation and to facilitate insurance approval for advanced imaging if necessary. A list of home exercises is provided to begin while awaiting formal physical therapy. Heat therapy and hot tub use are advised for symptomatic relief. Acetaminophen and NSAIDs (naproxen  or ibuprofen ) are discussed for analgesia, with significant caution due to hypertension, to be used as tolerated with heavy priority on Tylenol.  Discussed other nonpharmacologic treatments including heat for low back pain.  She is advised to return for follow-up and to schedule the sacroiliac joint injection as soon as available.   "

## 2024-08-14 ENCOUNTER — Encounter: Payer: Self-pay | Admitting: Family Medicine

## 2024-08-14 ENCOUNTER — Encounter (HOSPITAL_COMMUNITY): Payer: Self-pay | Admitting: Family Medicine

## 2024-08-15 MED ORDER — HYDROCODONE-ACETAMINOPHEN 5-325 MG PO TABS: 1.0000 | ORAL_TABLET | Freq: Four times a day (QID) | ORAL | 0 refills | Status: AC | PRN

## 2024-08-15 NOTE — Addendum Note (Signed)
 Addended by: DORINA DALLAS DORINA PA-C M on: 08/15/2024 12:04 PM   Modules accepted: Orders

## 2024-08-19 ENCOUNTER — Encounter (HOSPITAL_COMMUNITY): Payer: Self-pay | Admitting: Family Medicine

## 2024-08-19 ENCOUNTER — Encounter: Payer: Self-pay | Admitting: Family Medicine

## 2024-08-19 NOTE — Telephone Encounter (Signed)
 Auth Submission: NO AUTH NEEDED Site of care: [facility] Payer: [employer] Medication & CPT/J Code(s) submitted: Jubbonti (denosumab -bbdz) [phone] Diagnosis Code: M81.0 Route of submission (phone, fax, portal): PHONE Phone # [phone] Fax # Auth type: Buy/Bill HB Units/visits requested: 60mg  x 2 doses, q 6 months Reference number: [id number] Approval from: 08/19/2024 to 08/06/25    Dagoberto Armour, CPhT [facility] Phone: [phone] 08/19/2024

## 2024-08-20 ENCOUNTER — Ambulatory Visit

## 2024-08-27 ENCOUNTER — Ambulatory Visit (HOSPITAL_COMMUNITY)
Admission: RE | Admit: 2024-08-27 | Discharge: 2024-08-27 | Disposition: A | Discharge status: Home / Self Care | Payer: Self-pay | Source: Ambulatory Visit | Attending: Family Medicine | Admitting: Family Medicine

## 2024-08-27 VITALS — BP 127/78 | HR 92 | Temp 97.6°F | Resp 16

## 2024-08-27 DIAGNOSIS — M810 Age-related osteoporosis without current pathological fracture: Secondary | ICD-10-CM | POA: Insufficient documentation

## 2024-08-27 DIAGNOSIS — Z87310 Personal history of (healed) osteoporosis fracture: Secondary | ICD-10-CM | POA: Insufficient documentation

## 2024-08-27 MED ORDER — DENOSUMAB-BBDZ 60 MG/ML ~~LOC~~ SOSY
PREFILLED_SYRINGE | SUBCUTANEOUS | Status: AC
  Filled 2024-08-27: qty 1

## 2024-08-27 MED ORDER — DENOSUMAB-BBDZ 60 MG/ML ~~LOC~~ SOSY
60.0000 mg | PREFILLED_SYRINGE | Freq: Once | SUBCUTANEOUS | Status: AC
  Administered 2024-08-27: 60 mg via SUBCUTANEOUS

## 2024-09-09 ENCOUNTER — Other Ambulatory Visit: Payer: Self-pay | Admitting: Medical

## 2025-02-25 ENCOUNTER — Encounter (HOSPITAL_COMMUNITY): Payer: Self-pay
